# Patient Record
Sex: Female | Born: 2000 | Race: Black or African American | Hispanic: No | Marital: Single | Smoking: Former smoker
Health system: Southern US, Community
[De-identification: ages and names within clinical notes are randomized; demographics above are authoritative.]

## PROBLEM LIST (undated history)

## (undated) DIAGNOSIS — R569 Unspecified convulsions: Secondary | ICD-10-CM

## (undated) HISTORY — PX: LACERATION REPAIR: SHX5168

---

## 2020-09-24 ENCOUNTER — Emergency Department (HOSPITAL_COMMUNITY)
Admission: EM | Admit: 2020-09-24 | Discharge: 2020-09-25 | Disposition: A | Discharge status: Home / Self Care | Payer: Self-pay | Attending: Emergency Medicine | Admitting: Emergency Medicine

## 2020-09-24 ENCOUNTER — Other Ambulatory Visit: Payer: Self-pay

## 2020-09-24 DIAGNOSIS — U071 COVID-19: Secondary | ICD-10-CM | POA: Insufficient documentation

## 2020-09-24 DIAGNOSIS — R21 Rash and other nonspecific skin eruption: Secondary | ICD-10-CM | POA: Insufficient documentation

## 2020-09-24 DIAGNOSIS — Z5321 Procedure and treatment not carried out due to patient leaving prior to being seen by health care provider: Secondary | ICD-10-CM | POA: Insufficient documentation

## 2020-09-24 LAB — GROUP A STREP BY PCR: Group A Strep by PCR: NOT DETECTED

## 2020-09-24 NOTE — ED Provider Notes (Signed)
Emergency Medicine Provider Triage Evaluation Note  Donna Guzman , a 20 y.o. female  was evaluated in triage.  Pt complains of sore throat. The patient reports sore throat, congestion, and tactile fever, onset today. She also reports a rash that began one week ago. No known sick contacts. No new sexual partners. No known contacts with a similar rash. Reports a history of strep throat "around this time every year."  Review of Systems  Positive: Sore throat, congestion, fever, rash   Negative: Vomiting, diarrhea, Abdominal pain, shortness of breath  Physical Exam  BP (!) 133/99 (BP Location: Left Arm)   Pulse 91   Temp 99.8 F (37.7 C) (Oral)   Resp 16   Ht 5' 2.5" (1.588 m)   Wt 63.5 kg   SpO2 100%   BMI 25.20 kg/m  Gen:   Awake, no distress    Resp:  Normal effort  MSK:   Moves extremities without difficulty  Other:  Tonsillar edema and exudate. Posterior oropharynx is erythematous. Maculaopapular with some scaling scaling rash noted to the trunk and arms. No vesicles or pustules. Some lesions appear grouped. No red streaking. Anterior cervical lymphadenopathy.       Medical Decision Making  Medically screening exam initiated at 10:26 PM.  Appropriate orders placed.  Haylo Fake was informed that the remainder of the evaluation will be completed by another provider, this initial triage assessment does not replace that evaluation, and the importance of remaining in the ED until their evaluation is complete.  Viral symptoms onset today with a one week history of rash. Considered monkey pox, but patient has no new sexual partners or known sick contacts. Given her ENT exam, will order strep PCR and COVID-19. She will require further work up and evaluation in the ED.    Barkley Boards, PA-C 09/24/20 2233    Vanetta Mulders, MD 09/25/20 2014

## 2020-09-24 NOTE — ED Triage Notes (Signed)
Pt reports a rash all over that started a week ago. Pt denies pain or itching.

## 2020-09-24 NOTE — ED Notes (Signed)
Pt stated she was leaving ED.

## 2020-09-25 LAB — RESP PANEL BY RT-PCR (FLU A&B, COVID) ARPGX2
Influenza A by PCR: NEGATIVE
Influenza B by PCR: NEGATIVE
SARS Coronavirus 2 by RT PCR: POSITIVE — AB

## 2020-09-25 NOTE — ED Notes (Signed)
X1 for vitals recheck with no response 

## 2020-12-13 ENCOUNTER — Encounter (HOSPITAL_COMMUNITY): Payer: Self-pay | Admitting: Emergency Medicine

## 2020-12-13 ENCOUNTER — Other Ambulatory Visit: Payer: Self-pay

## 2020-12-13 ENCOUNTER — Emergency Department (HOSPITAL_COMMUNITY)
Admission: EM | Admit: 2020-12-13 | Discharge: 2020-12-17 | Disposition: A | Discharge status: Home / Self Care | Payer: Medicaid - Out of State | Attending: Emergency Medicine | Admitting: Emergency Medicine

## 2020-12-13 ENCOUNTER — Emergency Department (HOSPITAL_COMMUNITY): Payer: Medicaid - Out of State

## 2020-12-13 DIAGNOSIS — R45851 Suicidal ideations: Secondary | ICD-10-CM | POA: Insufficient documentation

## 2020-12-13 DIAGNOSIS — N9489 Other specified conditions associated with female genital organs and menstrual cycle: Secondary | ICD-10-CM | POA: Diagnosis not present

## 2020-12-13 DIAGNOSIS — E349 Endocrine disorder, unspecified: Secondary | ICD-10-CM

## 2020-12-13 DIAGNOSIS — G40909 Epilepsy, unspecified, not intractable, without status epilepticus: Secondary | ICD-10-CM | POA: Insufficient documentation

## 2020-12-13 DIAGNOSIS — R569 Unspecified convulsions: Secondary | ICD-10-CM

## 2020-12-13 DIAGNOSIS — R7309 Other abnormal glucose: Secondary | ICD-10-CM | POA: Diagnosis not present

## 2020-12-13 DIAGNOSIS — T450X4A Poisoning by antiallergic and antiemetic drugs, undetermined, initial encounter: Secondary | ICD-10-CM | POA: Diagnosis present

## 2020-12-13 DIAGNOSIS — Z20822 Contact with and (suspected) exposure to covid-19: Secondary | ICD-10-CM | POA: Diagnosis not present

## 2020-12-13 LAB — CBC WITH DIFFERENTIAL/PLATELET
Abs Immature Granulocytes: 0.02 10*3/uL (ref 0.00–0.07)
Basophils Absolute: 0 10*3/uL (ref 0.0–0.1)
Basophils Relative: 0 %
Eosinophils Absolute: 0.1 10*3/uL (ref 0.0–0.5)
Eosinophils Relative: 1 %
HCT: 30.5 % — ABNORMAL LOW (ref 36.0–46.0)
Hemoglobin: 9.2 g/dL — ABNORMAL LOW (ref 12.0–15.0)
Immature Granulocytes: 0 %
Lymphocytes Relative: 18 %
Lymphs Abs: 1.7 10*3/uL (ref 0.7–4.0)
MCH: 20.9 pg — ABNORMAL LOW (ref 26.0–34.0)
MCHC: 30.2 g/dL (ref 30.0–36.0)
MCV: 69.2 fL — ABNORMAL LOW (ref 80.0–100.0)
Monocytes Absolute: 0.7 10*3/uL (ref 0.1–1.0)
Monocytes Relative: 8 %
Neutro Abs: 6.7 10*3/uL (ref 1.7–7.7)
Neutrophils Relative %: 73 %
Platelets: 293 10*3/uL (ref 150–400)
RBC: 4.41 MIL/uL (ref 3.87–5.11)
RDW: 15.6 % — ABNORMAL HIGH (ref 11.5–15.5)
WBC: 9.3 10*3/uL (ref 4.0–10.5)
nRBC: 0 % (ref 0.0–0.2)

## 2020-12-13 LAB — COMPREHENSIVE METABOLIC PANEL
ALT: 9 U/L (ref 0–44)
AST: 15 U/L (ref 15–41)
Albumin: 3.3 g/dL — ABNORMAL LOW (ref 3.5–5.0)
Alkaline Phosphatase: 48 U/L (ref 38–126)
Anion gap: 7 (ref 5–15)
BUN: 8 mg/dL (ref 6–20)
CO2: 22 mmol/L (ref 22–32)
Calcium: 9.3 mg/dL (ref 8.9–10.3)
Chloride: 106 mmol/L (ref 98–111)
Creatinine, Ser: 0.71 mg/dL (ref 0.44–1.00)
GFR, Estimated: >60 mL/min (ref >60)
Glucose, Bld: 93 mg/dL (ref 70–99)
Potassium: 3.6 mmol/L (ref 3.5–5.1)
Sodium: 135 mmol/L (ref 135–145)
Total Bilirubin: 0.5 mg/dL (ref 0.3–1.2)
Total Protein: 7.4 g/dL (ref 6.5–8.1)

## 2020-12-13 LAB — ACETAMINOPHEN LEVEL: Acetaminophen (Tylenol), Serum: 28 ug/mL (ref 10–30)

## 2020-12-13 LAB — RESP PANEL BY RT-PCR (FLU A&B, COVID) ARPGX2
Influenza A by PCR: NEGATIVE
Influenza B by PCR: NEGATIVE
SARS Coronavirus 2 by RT PCR: NEGATIVE

## 2020-12-13 LAB — I-STAT VENOUS BLOOD GAS, ED
Acid-base deficit: 2 mmol/L (ref 0.0–2.0)
Bicarbonate: 24 mmol/L (ref 20.0–28.0)
Calcium, Ion: 1.29 mmol/L (ref 1.15–1.40)
HCT: 32 % — ABNORMAL LOW (ref 36.0–46.0)
Hemoglobin: 10.9 g/dL — ABNORMAL LOW (ref 12.0–15.0)
O2 Saturation: 88 %
Potassium: 3.6 mmol/L (ref 3.5–5.1)
Sodium: 139 mmol/L (ref 135–145)
TCO2: 25 mmol/L (ref 22–32)
pCO2, Ven: 47.3 mmHg (ref 44.0–60.0)
pH, Ven: 7.314 (ref 7.250–7.430)
pO2, Ven: 61 mmHg — ABNORMAL HIGH (ref 32.0–45.0)

## 2020-12-13 LAB — SALICYLATE LEVEL: Salicylate Lvl: <7 mg/dL — ABNORMAL LOW (ref 7.0–30.0)

## 2020-12-13 LAB — CBG MONITORING, ED: Glucose-Capillary: 105 mg/dL — ABNORMAL HIGH (ref 70–99)

## 2020-12-13 LAB — I-STAT BETA HCG BLOOD, ED (MC, WL, AP ONLY): I-stat hCG, quantitative: 17.8 m[IU]/mL — ABNORMAL HIGH (ref <5)

## 2020-12-13 LAB — HCG, QUANTITATIVE, PREGNANCY: hCG, Beta Chain, Quant, S: 29 m[IU]/mL — ABNORMAL HIGH (ref <5)

## 2020-12-13 LAB — MAGNESIUM: Magnesium: 1.8 mg/dL (ref 1.7–2.4)

## 2020-12-13 MED ORDER — LORAZEPAM 2 MG/ML IJ SOLN
INTRAMUSCULAR | Status: AC
  Filled 2020-12-13: qty 1

## 2020-12-13 MED ORDER — LORAZEPAM 2 MG/ML IJ SOLN
2.0000 mg | INTRAMUSCULAR | Status: DC | PRN
  Administered 2020-12-15: 2 mg via INTRAMUSCULAR
  Filled 2020-12-13: qty 1

## 2020-12-13 MED ORDER — LORAZEPAM 2 MG/ML IJ SOLN
2.0000 mg | Freq: Once | INTRAMUSCULAR | Status: AC
  Administered 2020-12-13: 2 mg via INTRAMUSCULAR
  Filled 2020-12-13: qty 1

## 2020-12-13 MED ORDER — LEVETIRACETAM IN NACL 1500 MG/100ML IV SOLN
1500.0000 mg | Freq: Once | INTRAVENOUS | Status: AC
  Administered 2020-12-13: 1500 mg via INTRAVENOUS

## 2020-12-13 MED ORDER — NALOXONE HCL 0.4 MG/ML IJ SOLN
0.4000 mg | Freq: Once | INTRAMUSCULAR | Status: DC
  Filled 2020-12-13: qty 1

## 2020-12-13 MED ORDER — DROPERIDOL 2.5 MG/ML IJ SOLN: 5.0000 mg | Freq: Once | INTRAMUSCULAR | Status: DC

## 2020-12-13 NOTE — ED Triage Notes (Addendum)
Pt BIB EMS due to a seizure. [place] was [place] [employer] and staff witnessed pt have 8 seizures. Pt has a hx of seizures. Pt is axox4 on arrival. Pts family states pt took a whole bottle of benadryl. Pt was trying to commit suicide today. Per EMS pt was not post ictal when they arrived

## 2020-12-13 NOTE — ED Notes (Addendum)
At approx. 2200 I approached the pt to discuss that she would be staying with [place] for psychiatric evaluation.  She was not pleased and stated that she was not staying and she wanted to speak with the doctor. After quite a bit of discussion between the doctor and the pt and escalation by the pt I stepped away for a second and quickly heard the doctor calling for security.  I came around the corner and found the EDP trying to hold the door closed so the pt could not leave.  Myself and the doctor stood in the doorway while we waited for security.   Around the time security arrived she grabbed her keychain with mace attached and threatened to spray anyone who came close. I went to get meds and returned to find a police officer with his taser pulled explaining to the pt that she is IVC'd and could not behave this way and could not leave.  It is at this time that I medicated her with 2mg  Ativan IM.  It took another 1/2 hr to 45 min to convince her to change and gather her belongings.  After further discussion with the EDP about what the plan was and what to expect, she eventually calmed down, accepted what was happening and cooperated.   Her belongings were collected and left with security at the front desk to give to her boyfriend who had been called by the pt to come get them.  Her valuables were placed in a security envelope which was then placed in a belonging bag with her knowledge.  She personally placed her cash and a gold colored necklace into a specimen cup and placed it into the security envelope.  1 cell phone, 1 phone charger, 1 key fob, 2 bronze colored bracelets, 1 yellow colored bracelet  and 1 ipod case containing 1 earpod were placed in the security envelope as well.   Pt was allowed to write down some important phone numbers on a piece of paper to make allowed phone calls. That paper is in pt folder.  Pt was informed she is allowed 2- 5-minute phone calls a day.   PT was wanded.

## 2020-12-13 NOTE — ED Provider Notes (Addendum)
[facility] EMERGENCY DEPARTMENT Provider Note   CSN: [id number] Arrival date & time: 12/13/20  1633     History Chief Complaint  Patient presents with   Seizures    Donna Guzman is a 20 y.o. female.   Seizures  20 year old female with medical history significant for seizure disorder, on Keppra who presents to the emergency department after reported overdose on Benadryl and subsequent seizures.  The patient was reportedly trying to commit suicide today and family at the [employer] state that they witnessed the patient taking a whole bottle of Benadryl in a suicide attempt.  The patient has a history of seizures and did not take her Keppra today.  She was witnessed to have approximately 8 seizures of unclear duration prior to arrival.  She arrived postictal, somnolent, GCS 12 (2-4-6), ABC intact.  Level 5 caveat due to mental status change.  EMS report: 20y/o female pt lying left lateral on the floor of a place of business currently seizing while in the care of [facility]. Staff was present to give a report of what they witnessed, which was a fall and approximately 8 seizures back to back. Staff was also on the pt's cell phone with a family member, which allowed EMS to obtain some background information. Pt was given 5mg  of Versed IM in the right deltoid to stop the seizures. Pt was then lifted from the floor to the stretcher, secured via 5 belt straps and loaded into the ambulance for further assessment and treatment. During all of this, family was able to tell EMS via speaker phone that the pt does have a PMH of seizures, takes medication for seizures, and that the pt also took "a whole bottle of Benadryl" approximately one hour prior to this event. Family member stated that the pt had called her and told her that she took the Benadryl. Vitals as noted. EKG unremarkable. Pt woke up fairly alert and somewhat oriented fairly quickly after she was loaded into the ambulance.  There was not much of a post-ictal state. Pt was able to correct EMS with her birthday. Pt was also able to tell EMS that she took approximately 10 (assumed 25mg ) Benadryl earlier, intentionally to harm herself. Pt stated that she has not had a seizure since she was a child. Pt also stated that her head hurt, likely from the fall. Pt denied any neck/back pain but a c-collar was applied due to MOI. IV established enroute. Pt remained stable and oriented but was still a bit "loopy" and very soft-spoken.  History reviewed. No pertinent past medical history.  There are no problems to display for this patient.   History reviewed. No pertinent surgical history.   OB History   No obstetric history on file.     History reviewed. No pertinent family history.     Home Medications Prior to Admission medications   Not on File    Allergies    Patient has no known allergies.  Review of Systems   Review of Systems  Unable to perform ROS: Mental status change  Neurological:  Positive for seizures.   Physical Exam Updated Vital Signs BP 111/70   Pulse 81   Temp 98.3 F (36.8 C) (Oral)   Resp 16   SpO2 97%   Physical Exam Vitals and nursing note reviewed.  Constitutional:      General: She is not in acute distress.    Appearance: She is well-developed.     Comments: GCS 12 (2-4-6).  Somnolent but arousable to tactile stimulation and will follow commands and speak.   HENT:     Head: Normocephalic and atraumatic.  Eyes:     Conjunctiva/sclera: Conjunctivae normal.     Pupils: Pupils are equal, round, and reactive to light.  Cardiovascular:     Rate and Rhythm: Normal rate and regular rhythm.     Heart sounds: No murmur heard. Pulmonary:     Effort: Pulmonary effort is normal. No respiratory distress.     Breath sounds: Normal breath sounds.  Abdominal:     General: There is no distension.     Palpations: Abdomen is soft.     Tenderness: There is no abdominal tenderness.  There is no guarding.  Musculoskeletal:        General: No deformity or signs of injury.     Cervical back: Normal range of motion and neck supple.  Skin:    General: Skin is warm and dry.     Findings: No lesion or rash.  Neurological:     Mental Status: She is alert.     Cranial Nerves: No cranial nerve deficit.     Sensory: No sensory deficit.     Motor: No weakness.     Comments: Moving all four extremities antigravity    ED Results / Procedures / Treatments   Labs (all labs ordered are listed, but only abnormal results are displayed) Labs Reviewed  CBC WITH DIFFERENTIAL/PLATELET - Abnormal; Notable for the following components:      Result Value   Hemoglobin 9.2 (*)    HCT 30.5 (*)    MCV 69.2 (*)    MCH 20.9 (*)    RDW 15.6 (*)    All other components within normal limits  COMPREHENSIVE METABOLIC PANEL - Abnormal; Notable for the following components:   Albumin 3.3 (*)    All other components within normal limits  SALICYLATE LEVEL - Abnormal; Notable for the following components:   Salicylate Lvl <7.0 (*)    All other components within normal limits  HCG, QUANTITATIVE, PREGNANCY - Abnormal; Notable for the following components:   hCG, Beta Chain, Quant, S 29 (*)    All other components within normal limits  CBG MONITORING, ED - Abnormal; Notable for the following components:   Glucose-Capillary 105 (*)    All other components within normal limits  I-STAT BETA HCG BLOOD, ED ([facility], [facility], [facility] ONLY) - Abnormal; Notable for the following components:   I-stat hCG, quantitative 17.8 (*)    All other components within normal limits  I-STAT VENOUS BLOOD GAS, ED - Abnormal; Notable for the following components:   pO2, Ven 61.0 (*)    HCT 32.0 (*)    Hemoglobin 10.9 (*)    All other components within normal limits  RESP PANEL BY RT-PCR (FLU A&B, COVID) ARPGX2  MAGNESIUM  ACETAMINOPHEN LEVEL  URINALYSIS, ROUTINE W REFLEX MICROSCOPIC  BLOOD GAS, VENOUS    EKG EKG  Interpretation  Date/Time:  Friday December 13 2020 16:38:37 EST Ventricular Rate:  97 PR Interval:  147 QRS Duration: 66 QT Interval:  332 QTC Calculation: 422 R Axis:   62 Text Interpretation: Sinus rhythm Confirmed by Ernie Avena ([id number]) on 12/13/2020 4:47:28 PM  Radiology CT HEAD WO CONTRAST ( )  Result Date: 12/13/2020 CLINICAL DATA:  Seizure, nontraumatic (Age 49-40y) Mental status change, unknown cause EXAM: CT HEAD WITHOUT CONTRAST TECHNIQUE: Contiguous axial images were obtained from the base of the skull through the vertex without intravenous contrast. COMPARISON:  None. FINDINGS: Brain: No intracranial hemorrhage, mass effect, or midline shift. No hydrocephalus. The basilar cisterns are patent. No evidence of territorial infarct or acute ischemia. No extra-axial or intracranial fluid collection. Vascular: No hyperdense vessel or unexpected calcification. Skull: Normal. Negative for fracture or focal lesion. Sinuses/Orbits: Paranasal sinuses and mastoid air cells are clear. The visualized orbits are unremarkable. Other: None. IMPRESSION: Negative noncontrast head CT. Electronically Signed   By: Narda Rutherford M.D.   On: 12/13/2020 18:56    Procedures Procedures   Medications Ordered in ED Medications  levETIRAcetam (KEPPRA) IVPB 1500 mg/ 100 mL premix (0 mg Intravenous Stopped 12/13/20 1753)    ED Course  I have reviewed the triage vital signs and the nursing notes.  Pertinent labs & imaging results that were available during my care of the patient were reviewed by me and considered in my medical decision making (see chart for details).    MDM Rules/Calculators/A&P                           20 year old female with medical history significant for seizure disorder, on Keppra who presents to the emergency department after reported overdose on Benadryl and subsequent seizures.  The patient was reportedly trying to commit suicide today and family at the [employer] store state  that they witnessed the patient taking a whole bottle of Benadryl in a suicide attempt.  The patient has a history of seizures and did not take her Keppra today.  She was witnessed to have approximately 8 seizures of unclear duration prior to arrival.  She arrived postictal, somnolent, GCS 12 (2-4-6), ABC intact.  The patient was loaded with 1500 mg of Keppra on arrival.  Unclear if additional coingestions.  The patient is not presenting with a clear anticholinergic toxidrome.  Her pupils on exam were pinpoint.  On arrival, the patient was vitally stable.  An EKG was performed on arrival significant for sinus rhythm, pulse 97, normal intervals, QRS 6 6, QTc 422, no ST segment changes or T wave inversions.  VBG unremarkable, pH 7.31 PCO2 47, HCO3 24, mildly low hemoglobin 10.9, i-STAT beta-hCG elevated to 17.8, quantitative hCG serum elevated to 29.  The patient states that she could be pregnant.  Acetaminophen level 28.  CMP unremarkable.  COVID-19 and influenza PCR negative.  CT of the head without acute intracranial abnormality.  CBC without a leukocytosis, microcytic anemia to 9.2, no platelet abnormality, magnesium normal 1.8.  On repeat assessment, the patient was alert and oriented x3, GCS 15, no longer postictal.  She states that she thinks she took around 4x 25 mg Benadryl or 100 mg total.  This ingestion occurred 1 hour prior to arrival.  She continues to display no symptoms of an anticholinergic toxidrome.  At this time, due to lack of toxidrome, observation status postingestion for close to 4 hours, feel the patient is currently medically cleared for psychiatric evaluation.    Of note, the patient's HPI has changed from her EMS report to now.  She currently denies SI, HI, AVH.  She denies any history of depression.  She states this was not a suicide attempt. She states she took three Benadryl, however, she told me earlier she took four. She reportedly told EMS that she took 10 25mg  Benadryl. She  continues to be without an anticholinergic toxidrome and has since returned to her baseline.  I am unable to obtain collateral information at this time. TTS consult placed.  10:10 PM When informed of the need to stay for possible psychiatric consultation, the patient told me she called her husband to provide collateral.  The number she reportedly called was listed in her phone as her brother.  He was unable to provide any information regarding the events that occurred earlier today.  The patient demanded to leave and made threats to staff on attempts to leave the emergency department.  She subsequently became agitated and combative and threatened to [facility] various staff members when attempts were made to take her belongings.  She subsequently required IM Ativan for chemical restraint. Given the patient's changing story, erratic behavior, reported suicide attempt by overdose earlier today, aggressive behavior, IVC paperwork filed.  Final Clinical Impression(s) / ED Diagnoses Final diagnoses:  Seizures (HCC)  Diphenhydramine overdose of undetermined intent, initial encounter    Rx / DC Orders ED Discharge Orders     None        Ernie Avena, MD 12/13/20 1914        Ernie Avena, MD 12/13/20 2222    Ernie Avena, MD 12/14/20 2359

## 2020-12-14 LAB — URINALYSIS, ROUTINE W REFLEX MICROSCOPIC
Bilirubin Urine: NEGATIVE
Glucose, UA: NEGATIVE mg/dL
Hgb urine dipstick: NEGATIVE
Ketones, ur: NEGATIVE mg/dL
Nitrite: NEGATIVE
Protein, ur: NEGATIVE mg/dL
Specific Gravity, Urine: 1.027 (ref 1.005–1.030)
pH: 6 (ref 5.0–8.0)

## 2020-12-14 LAB — HCG, QUANTITATIVE, PREGNANCY: hCG, Beta Chain, Quant, S: 59 m[IU]/mL — ABNORMAL HIGH (ref <5)

## 2020-12-14 LAB — RAPID URINE DRUG SCREEN, HOSP PERFORMED
Amphetamines: NOT DETECTED
Barbiturates: NOT DETECTED
Benzodiazepines: POSITIVE — AB
Cocaine: NOT DETECTED
Opiates: NOT DETECTED
Tetrahydrocannabinol: POSITIVE — AB

## 2020-12-14 LAB — PREGNANCY, URINE: Preg Test, Ur: NEGATIVE

## 2020-12-14 NOTE — B[facility] Counselor (Signed)
TTS received IVC and is now waiting for RN to room pt to complete the assessment.

## 2020-12-14 NOTE — ED Notes (Signed)
Patient currently using provided phone to make a personal phone call.  Has been updated on plan of care

## 2020-12-14 NOTE — ED Notes (Signed)
Lunch tray arrived, pt states she is allergic to the dumplings that are on her tray, this RN called to reorder her a lunch tray, will arrive around 2pm

## 2020-12-14 NOTE — ED Notes (Signed)
Pt using the phone at this time.  

## 2020-12-14 NOTE — BH Assessment (Signed)
TTS clinician attempted TTS consult. Per Ivin Booty, RN, patient medicated and asleep, unable to participate in assessment. TTS will attempt at later time.

## 2020-12-14 NOTE — ED Notes (Signed)
Attempted to take vitals, pt currently using the phone.

## 2020-12-14 NOTE — ED Notes (Signed)
Called lab to add urine pregnancy due to elevated hcg
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
## 2020-12-15 MED ORDER — HALOPERIDOL LACTATE 5 MG/ML IJ SOLN
2.5000 mg | Freq: Once | INTRAMUSCULAR | Status: AC
  Administered 2020-12-15: 2.5 mg via INTRAMUSCULAR
  Filled 2020-12-15: qty 1

## 2020-12-15 NOTE — ED Notes (Signed)
Pt is awake and alert, calm, and cooperative at this time. She requested warm blankets and has returned to her bed without incident.

## 2020-12-15 NOTE — ED Notes (Signed)
Pt has been found walking the hallway out of the pod and leaving her assigned area. She has been asked multiple times to stay in her assigned area & to not stand in the middle of the hallway. Security did stop by again to have a word with her, however, pt continues to walk away, ignoring staff.

## 2020-12-15 NOTE — ED Notes (Addendum)
Pt having her 1st phone call of the day approximately 10 minutes ago. Pt began rasing her voice and cussing at the recipient of the phone call. Pt asked to lower her voice and hang up at this time by primary RN. Pt then threw the phone onto the floor and began cussing at the staff stating, "Lavenia Atlas been here 3 days. Ya'll don't care about me. Don't worry about me now. I'm just the little black girl in the hallway. I'm leaving so go ahead and call your little police officer or whatever you got to do cause I'm leaving." Security call and pt began walking towards the exit. Pt was able to be escorted back to her bed by Malachi Bonds, tech. Pt calm at this time taking a shower. Dr. Wallace Cullens aware.

## 2020-12-15 NOTE — ED Notes (Signed)
Pt has remained agitated, wandering, not following directions. Cursing and threatening to leave. RN, MD and pharmacist discussed medication options safe in pregnancy, minimal dose of haldol found to be safe. Pt was given IM haldol at this time with security at bedside.

## 2020-12-15 NOTE — ED Notes (Signed)
Pt laying in bed on her side in hallway, normal work of breathing and no distress noted. RN did not disturb pt.

## 2020-12-15 NOTE — ED Notes (Addendum)
Pt has been increasingly agitated since around 0830, abusing phone use and yelling "I've been here three fuckin days, I'm not staying here no more!" Pt states she was never suicidal and never took any pills. Pt did require IM ativan and security assistance for meds. Pt states "I'm going to run the fuck out of here." Security at bedside at this time.

## 2020-12-15 NOTE — Progress Notes (Signed)
Per Vivien Presto, patient meets criteria for inpatient treatment. There are no available or appropriate beds at [facility] today, per [facility]. CSW faxed referrals to the following facilities for review:  [facility]  Pending - No Request Sent N/A [address] [place] [address] [phone] --  [facility]  Pending - No Request Sent N/A [address] [place] [address] [phone] --  [facility]  Pending - No Request Sent N/A [address] [place] [address] [phone] --  [facility]  Pending - No Request Sent N/A [address] [place] [address] [phone] --  [facility]  Pending - No Request Sent N/A [address] [place], [place] [address] [phone] --  [facility]  Pending - No Request Sent N/A [address] [place] [address] [phone] --  [facility]  Pending - No Request Sent N/A [address] [place] [address] [phone] --  [facility]  Pending - No Request Sent N/A [address] [place] [address] [phone] --  [facility]  Pending - No Request Sent N/A [address], [place] [address] [phone] --  [facility]  Pending - No Request Sent N/A [address] [place] [address] [phone] --  [facility]  Pending - No Request Sent N/A [address] Karolee Ohs., [place] [address] [phone] --  [facility]  Pending - No Request Sent N/A [address] [place] [address] [phone] --  [facility]  Pending - No Request Sent N/A [address] [place] [address] [phone]      TTS will continue to seek bed placement.  Crissie Reese, MSW, LCSW-A, LCAS-A Phone: [phone] Disposition/TOC

## 2020-12-15 NOTE — ED Notes (Signed)
Pt is currently having her 2nd phone call. This will make it her last/final call of the day until tomorrow.

## 2020-12-15 NOTE — ED Notes (Signed)
Patient out of bed to use restroom.  Requesting a snack at this time.  Provided with apple juice and sandwich pack.  Patient updated on plan of care.  Currently calm and cooperative

## 2020-12-15 NOTE — ED Notes (Signed)
Pt sitting up in stretcher talking to security staff, remains intermittently agitated, currently talking calmly but reverts to yelling and cursing, then calms again.

## 2020-12-15 NOTE — ED Notes (Signed)
Pt still standing around hallway bed and periodically wandering. Has calmed down and not cursing but refusing to sit on bed or stay in assigned area. RN discussing with psych NP And SW via secure chat.

## 2020-12-15 NOTE — ED Notes (Signed)
Pt woke up around 1600 and demanded to use phone again. Re-oriented pt to IVC status and rules regarding phone use. Pt attempted to elope at this time, ran out back door but was apprehended by security staff and returned to department. Dr Posey Rea gave verbal order for restraints and haldol. Pt is oriented but irrational. Tried to communicate with pt that she does not have to go in restraints or be medicated if she chooses to comply with plan of care, but pt says "fuck this bitch, I want the fuck out of here, fuck your choices bitch." Pt was placed in 4 point restraints at 1605 as she kicked her wrists out of the 2 point restraints. Pt states "I been to a real psych ward, this ain't shit."  Pt with sitter at bedside, currently in 4 point restraints and cursing at staff.

## 2020-12-15 NOTE — ED Notes (Signed)
Patient currently resting on bed in hallway.  Has head covered with blanket.  Respirations even and unlabored.

## 2020-12-15 NOTE — ED Notes (Signed)
Pt resting in bed with eyes closed. Released restraints at this time from all four extremities. Sitter at bedside.

## 2020-12-16 MED ORDER — LEVETIRACETAM 500 MG PO TABS
500.0000 mg | ORAL_TABLET | Freq: Two times a day (BID) | ORAL | Status: DC
  Administered 2020-12-17: 500 mg via ORAL
  Filled 2020-12-16: qty 1

## 2020-12-16 NOTE — ED Notes (Addendum)
Pt has had a wonderful day with no complaints. Pt has been calm, cooperative, and understanding all day. No seizure activity noted since pt has arrived to emergency room.  Pt now updated on plan of care

## 2020-12-16 NOTE — ED Notes (Signed)
Pt to shower with sitter. Given new set of scrubs and linen changed

## 2020-12-16 NOTE — BH Assessment (Signed)
RE-ASSESSMENT   This Clinical research associate evaluated patient this date to assess current mental health status. Patient denies any S/I, H/I or AVH. Patient presents with a pleasant affect and is requesting to be discharged although renders a conflicting history this date in reference to why she initially presented. Patient states she resides in [place] with her husband and commutes to [place] to "work at [employer]" although states she "lost her job" but still "stays in hotels on weekends" because her and her husband have been separated. Patient gives consent to speak to husband Pennie Rushing [phone] this date although he is unable to be reached. Patient denies any past mental health diagnosis although per notes on 11/13 she stated "I have been to a real psych ward and this ain't s#it." See note of 11/13 at 1630. Rankin NP recommends a continued inpatient admssion to assist with ongoing needs.

## 2020-12-16 NOTE — Progress Notes (Signed)
Pt is under review at [facility] for inpatient behavioral health placement.  Per [facility] Malva Limes, RN ---consulted with Dr. Lucianne Muss who recommends due to pregnancy and seizure disorder to restart keppra and if seizure free bhh may consider tomorrow.  CSW will contuine to assist with inpatient behavioral health placement.  Maryjean Ka, MSW, LCSWA 12/16/2020 11:14 PM

## 2020-12-16 NOTE — ED Notes (Signed)
Pt up to desk making first phone call. Pt is calm and cooperative at this time.

## 2020-12-16 NOTE — ED Notes (Addendum)
Pt resting with eyes closed in bed. Sitter at bedside. IVC paperwork in yellow folder at nurses station

## 2020-12-16 NOTE — ED Notes (Signed)
Pt is awake and calm at this time. Vitals obtained and pt was given apple juice and a warm blanket. No concerns expressed and no issues to report.

## 2020-12-17 ENCOUNTER — Encounter (HOSPITAL_COMMUNITY): Payer: Self-pay | Admitting: Registered Nurse

## 2020-12-17 DIAGNOSIS — R45851 Suicidal ideations: Secondary | ICD-10-CM

## 2020-12-17 DIAGNOSIS — R569 Unspecified convulsions: Secondary | ICD-10-CM

## 2020-12-17 MED ORDER — LEVETIRACETAM 750 MG PO TABS
750.0000 mg | ORAL_TABLET | Freq: Two times a day (BID) | ORAL | 0 refills | Status: DC
Start: 2020-12-17 — End: 2020-12-27

## 2020-12-17 NOTE — Progress Notes (Signed)
Outpatient counseling resources attached to AVS

## 2020-12-17 NOTE — Discharge Instructions (Addendum)
Your pregnancy test was positive.  You need to follow-up with a gynecologist.  You should also follow-up with a neurologist.  You should not drive until cleared by neurology, typically they recommend being seizure-free for at least 6 months.  If you have any thoughts of depression, suicide, hurting yourself or further seizures, please come back to ER for reassessment.  Our neurologist has recommended increasing her Keppra to 750 mg twice a day while you are pregnant.

## 2020-12-17 NOTE — ED Notes (Addendum)
Pt need neuro consult. Dr. Stevie Kern aware.

## 2020-12-17 NOTE — Consult Note (Signed)
Telepsych Consultation   Reason for Consult:  Suicidal ideation Referring Physician:  Regan Lemming, MD Location of Patient: [facility] ED Location of Provider: Other: [facility]  Patient Identification: Donna Guzman MRN:  [id number] Principal Diagnosis: Seizure [facility]) Diagnosis:  Principal Problem:   Seizure ([place]) Active Problems:   Suicidal ideation   Total Time spent with patient: 30 minutes  Subjective:   Donna Guzman is a 21 y.o. female patient admitted [facility] ED after being brought in by EMS with complaints of overdose on benadryl.  HPI:  Donna Guzman, 20 y.o., female patient seen via tele health by this provider, consulted with Dr. Hampton Abbot; and chart reviewed on 12/17/20.  On evaluation Donna Guzman reports she is feeling fine and that she is ready to go home.  Patient states she has been on her best behavior.  Patient reports she never said or told anyone that she tried to kill herself.  States that she did take some benadryl but it was to try and sleep.  "I promise to God I didn't try to kill myself.  I had taken some Benadryl earlier because I wanted to sleep; but I had my sister on the phone and told her I had went to store.  I had her on the phone when I started feeling light headed; she told me to call ambulance and I remember asking someone in store to call just before I passed out and that's all I remember."  Patient denies suicidal/self-harm/homicidal ideation, psychosis, and paranoia. "  Gave permission to speak with husband.  Informed unable to contact then gave permission to speak with her sister.   During evaluation Donna Guzman is sitting upright in chair in no acute distress.  She is alert, oriented x 4, pleasant, calm and cooperative.  Her mood is euthymic with congruent affect.  She does not appear to be responding to internal/external stimuli or delusional thoughts.  Patient denies suicidal/self-harm/homicidal ideation, psychosis, and paranoia.  Patient answered question  appropriately.  Collateral Information:  Spoke to patients' sister Donna Guzman at [phone].  States that she feels that patient is safe and not a danger to herself.  States that patient will be staying with her for a while but would like resources for neurology and OB.  When asked if she felt sister would kill herself she stated "She crazy but not that crazy."  States she was on the phone with sister prior to sister having seizure and passing out.  States that patient had not taken an overdose of benadryl and hadn't tired to kill herself.  States she will come pick sister up at ED because doesn't want her driving related to seizure activity.    Past Psychiatric History: Denies prior psychiatric history  Risk to Self:  Denies Risk to Others:  Denies Prior Inpatient Therapy:  Denies Prior Outpatient Therapy:  Denies  Past Medical History: History reviewed. No pertinent past medical history. History reviewed. No pertinent surgical history. Family History: History reviewed. No pertinent family history. Family Psychiatric  History: Denies Social History:  Social History   Substance and Sexual Activity  Alcohol Use None     Social History   Substance and Sexual Activity  Drug Use Not on file    Social History   Socioeconomic History   Marital status: Married    Spouse name: Not on file   Number of children: Not on file   Years of education: Not on file   Highest education level: Not on file  Occupational History   Not on file  Tobacco Use   Smoking status: Not on file   Smokeless tobacco: Not on file  Substance and Sexual Activity   Alcohol use: Not on file   Drug use: Not on file   Sexual activity: Not on file  Other Topics Concern   Not on file  Social History Narrative   Not on file   Social Determinants of Health   Financial Resource Strain: Not on file  Food Insecurity: Not on file  Transportation Needs: Not on file  Physical Activity: Not on file  Stress:  Not on file  Social Connections: Not on file   Additional Social History:    Allergies:  No Known Allergies  Labs: No results found for this or any previous visit (from the past 48 hour(s)).  Medications:  Current Facility-Administered Medications  Medication Dose Route Frequency Provider Last Rate Last Admin   levETIRAcetam (KEPPRA) tablet 500 mg  500 mg Oral BID Joanne Brander B, NP   500 mg at 12/17/20 0926   LORazepam (ATIVAN) injection 2 mg  2 mg Intramuscular Q4H PRN Regan Lemming, MD   2 mg at 12/15/20 [id number]   Current Outpatient Medications  Medication Sig Dispense Refill   levETIRAcetam (KEPPRA) 500 MG tablet Take 500 mg by mouth in the morning and at bedtime.      Musculoskeletal: Strength & Muscle Tone: within normal limits Gait & Station: normal Patient leans: N/A     Psychiatric Specialty Exam:  Presentation  General Appearance: Appropriate for Environment  Eye Contact:Good  Speech:Clear and Coherent; Normal Rate  Speech Volume:Normal  Handedness:Right   Mood and Affect  Mood:Euthymic  Affect:Appropriate; Congruent   Thought Process  Thought Processes:Coherent; Goal Directed  Descriptions of Associations:Intact  Orientation:Full (Time, Place and Person)  Thought Content:Logical; WDL  History of Schizophrenia/Schizoaffective disorder:No  Duration of Psychotic Symptoms:No data recorded Hallucinations:Hallucinations: None  Ideas of Reference:None  Suicidal Thoughts:Suicidal Thoughts: No  Homicidal Thoughts:Homicidal Thoughts: No   Sensorium  Memory:Immediate Good; Recent Good; Remote Good  Judgment:Intact  Insight:Present   Executive Functions  Concentration:Good  Attention Span:Good  Baker of Knowledge:Good  Language:Good   Psychomotor Activity  Psychomotor Activity:Psychomotor Activity: Normal   Assets  Assets:Communication Skills; Desire for Improvement; Financial Resources/Insurance; Housing;  Resilience; Social Support; Transportation   Sleep  Sleep:Sleep: Good    Physical Exam: Physical Exam Vitals and nursing note reviewed. Exam conducted with a chaperone present.  Constitutional:      General: She is not in acute distress.    Appearance: Normal appearance. She is not ill-appearing.  Cardiovascular:     Rate and Rhythm: Normal rate.  Pulmonary:     Effort: Pulmonary effort is normal.  Neurological:     Mental Status: She is alert and oriented to person, place, and time.  Psychiatric:        Attention and Perception: Attention and perception normal. She does not perceive auditory or visual hallucinations.        Mood and Affect: Mood and affect normal.        Speech: Speech normal.        Behavior: Behavior normal. Behavior is cooperative.        Thought Content: Thought content normal. Thought content is not paranoid or delusional. Thought content does not include homicidal or suicidal ideation.        Cognition and Memory: Cognition and memory normal.        Judgment: Judgment normal.  Review of Systems  Constitutional: Negative.   HENT: Negative.    Eyes: Negative.   Respiratory: Negative.    Cardiovascular: Negative.   Gastrointestinal: Negative.   Genitourinary: Negative.   Musculoskeletal: Negative.   Skin: Negative.   Neurological: Negative.   Endo/Heme/Allergies: Negative.   Psychiatric/Behavioral:  Negative for hallucinations (Denies) and suicidal ideas. Depression: Stable. Substance abuse: THC.The patient is not nervous/anxious (Stable) and does not have insomnia.   Blood pressure 99/62, pulse 78, temperature 98 F (36.7 C), resp. rate 16, SpO2 100 %. There is no height or weight on file to calculate BMI.  Treatment Plan Summary: Plan Psychiatrically clear to follow up with outpatient psychiatric services, neurology, and OB  Social work/TOC consult ordered:  Patient psychiatrically cleared.  Will need referral/resources for neurology and OB/GYN.   Also need resources for outpatient psychiatric services added to AVS.    Disposition: No evidence of imminent risk to self or others at present.   Patient does not meet criteria for psychiatric inpatient admission. Supportive therapy provided about ongoing stressors. Refer to IOP. Discussed crisis plan, support from social network, calling 911, coming to the Emergency Department, and calling Suicide Hotline.  This service was provided via telemedicine using a 2-way, interactive audio and video technology.  Names of all persons participating in this telemedicine service and their role in this encounter. Name: Assunta Found Role: NP  Name: Dr. Nelly Rout Role: Psychiatrist  Name: Donna Guzman Role: Patient  Name:  Role:     Secure message sent to patient's nurse [place] Boney, RN informing: Psychiatric assessment completed and patient psychiatrically cleared.  Patient will need prescription for Keppra until she is able to follow-up with her neurologist and obstetrician.  Patient's sister will be picking her up from the hospital had concerns and did not want patient driving related to seizure activity.  Resources for outpatient psychiatric services to be added to AVS by social work.  Please inform MD only default listed.  Donna Moultrie, NP 12/17/2020 11:36 AM

## 2020-12-17 NOTE — ED Provider Notes (Signed)
Emergency Medicine Observation Re-evaluation Note  Donna Guzman is a 20 y.o. female, seen on rounds today.  Pt initially presented to the ED for complaints of Seizures Currently, the patient is resting, awaiting in patient psych.  Physical Exam  BP 99/62 (BP Location: Left Arm)   Pulse 78   Temp 98 F (36.7 C)   Resp 16   SpO2 100%  Physical Exam General: calm Cardiac: warm and well perfused Lungs: even and unlabored Psych: cvalm  ED Course / MDM  EKG:EKG Interpretation  Date/Time:  Friday December 13 2020 16:38:37 EST Ventricular Rate:  97 PR Interval:  147 QRS Duration: 66 QT Interval:  332 QTC Calculation: 422 R Axis:   62 Text Interpretation: Sinus rhythm Confirmed by Ernie Avena ([id number]) on 12/13/2020 4:47:28 PM  I have reviewed the labs performed to date as well as medications administered while in observation.  Recent changes in the last 24 hours include psych reassessed yesterday and still rec in pt.  Plan  Current plan is for in patient, appreciate psych team assistance in finding placment Donna Guzman is under involuntary commitment.      Milagros Loll, MD 12/17/20 [phone]

## 2020-12-18 ENCOUNTER — Encounter (HOSPITAL_COMMUNITY): Payer: Self-pay | Admitting: Obstetrics & Gynecology

## 2020-12-18 ENCOUNTER — Inpatient Hospital Stay (HOSPITAL_COMMUNITY)
Admission: AD | Admit: 2020-12-18 | Discharge: 2020-12-18 | Disposition: A | Discharge status: Home / Self Care | Payer: Medicaid - Out of State | Attending: Obstetrics & Gynecology | Admitting: Obstetrics & Gynecology

## 2020-12-18 ENCOUNTER — Inpatient Hospital Stay (HOSPITAL_COMMUNITY): Payer: Medicaid - Out of State

## 2020-12-18 ENCOUNTER — Ambulatory Visit: Payer: Medicaid Other | Admitting: Neurology

## 2020-12-18 DIAGNOSIS — Z87891 Personal history of nicotine dependence: Secondary | ICD-10-CM | POA: Insufficient documentation

## 2020-12-18 DIAGNOSIS — O26891 Other specified pregnancy related conditions, first trimester: Secondary | ICD-10-CM | POA: Diagnosis present

## 2020-12-18 DIAGNOSIS — R109 Unspecified abdominal pain: Secondary | ICD-10-CM | POA: Diagnosis not present

## 2020-12-18 DIAGNOSIS — Z3A01 Less than 8 weeks gestation of pregnancy: Secondary | ICD-10-CM | POA: Diagnosis not present

## 2020-12-18 DIAGNOSIS — O3680X0 Pregnancy with inconclusive fetal viability, not applicable or unspecified: Secondary | ICD-10-CM | POA: Insufficient documentation

## 2020-12-18 DIAGNOSIS — M545 Low back pain, unspecified: Secondary | ICD-10-CM | POA: Insufficient documentation

## 2020-12-18 DIAGNOSIS — R10819 Abdominal tenderness, unspecified site: Secondary | ICD-10-CM | POA: Insufficient documentation

## 2020-12-18 HISTORY — DX: Unspecified convulsions: R56.9

## 2020-12-18 LAB — URINALYSIS, ROUTINE W REFLEX MICROSCOPIC
Bilirubin Urine: NEGATIVE
Glucose, UA: NEGATIVE mg/dL
Hgb urine dipstick: NEGATIVE
Ketones, ur: NEGATIVE mg/dL
Nitrite: NEGATIVE
Protein, ur: NEGATIVE mg/dL
Specific Gravity, Urine: 1.029 (ref 1.005–1.030)
pH: 5 (ref 5.0–8.0)

## 2020-12-18 MED ORDER — PREPLUS 27-1 MG PO TABS: 1.0000 | ORAL_TABLET | Freq: Every day | ORAL | 13 refills | Status: DC

## 2020-12-18 NOTE — MAU Provider Note (Signed)
History     CSN: [id number]  Arrival date and time: 12/18/20 1045  Event Date/Time  First Provider Initiated Contact with Patient 12/18/20 1131     Chief Complaint  Patient presents with   Back Pain   HPI Donna Guzman is a 20 y.o. G1P0 at [redacted]w[redacted]d who presents to MAU with chief complaint of low back pain and abdominal tenderness. These are new problems, onset within the past two days. Patient's back pain is on her left side near her SI joint. She is unable to provide a pain score. She denies aggravating or alleviating factors. Pain was not the result of a fall or other trauma. She has not taken medication or tried other treatments for these complaints. She denies dysuria, vaginal bleeding, abnormal vaginal discharge, fever or recent illness.  OB History     Gravida  1   Para      Term      Preterm      AB      Living         SAB      IAB      Ectopic      Multiple      Live Births              Past Medical History:  Diagnosis Date   Seizures (H[facility])     Past Surgical History:  Procedure Laterality Date   NO PAST SURGERIES      Family History  Problem Relation Age of Onset   Diabetes Mother    Hypertension Mother    Hypertension Father    ADD / ADHD Sister     Social History   Tobacco Use   Smoking status: Former    Types: Cigarettes    Quit date: 12/13/2020    Years since quitting: 0.0   Smokeless tobacco: Former  Building services engineer Use: Former  Substance Use Topics   Alcohol use: Not Currently   Drug use: Not Currently    Allergies: No Known Allergies  Medications Prior to Admission  Medication Sig Dispense Refill Last Dose   levETIRAcetam (KEPPRA) 750 MG tablet Take 1 tablet (750 mg total) by mouth in the morning and at bedtime. 60 tablet 0 12/17/2020    Review of Systems  Musculoskeletal:  Positive for back pain.  All other systems reviewed and are negative. Physical Exam   Blood pressure 122/81, pulse 89, temperature 98.5 F  (36.9 C), resp. rate 18, height 5' 2.5" (1.588 m), weight 67.6 kg, last menstrual period 11/18/2020, SpO2 100 %.  Physical Exam Vitals and nursing note reviewed. Exam conducted with a chaperone present.  Constitutional:      Appearance: Normal appearance.  Cardiovascular:     Rate and Rhythm: Normal rate and regular rhythm.     Pulses: Normal pulses.     Heart sounds: Normal heart sounds.  Pulmonary:     Effort: Pulmonary effort is normal.     Breath sounds: Normal breath sounds.  Abdominal:     General: Bowel sounds are normal.  Skin:    Capillary Refill: Capillary refill takes less than 2 seconds.  Neurological:     Mental Status: She is alert and oriented to person, place, and time.  Psychiatric:        Mood and Affect: Mood normal.        Behavior: Behavior normal.        Thought Content: Thought content normal.  Judgment: Judgment normal.    MAU Course/MDM  Procedures  Orders Placed This Encounter  Procedures   US OB LESS THAN 14 WEEKS WITH OB TRANSVAGINAL   US OB Transvaginal   Urinalysis, Routine w reflex microscopic Urine, Clean Catch   Discharge patient   Patient Vitals for the past 24 hrs:  BP Temp Pulse Resp SpO2 Height Weight  12/18/20 1430 125/68 -- 80 18 -- -- --  12/18/20 1118 122/81 -- 89 -- 100 % -- --  12/18/20 1105 116/77 98.5 F (36.9 C) 87 18 -- 5' 2.5" (1.588 m) 67.6 kg   Results for orders placed or performed during the hospital encounter of 12/18/20 (from the past 24 hour(s))  Urinalysis, Routine w reflex microscopic Urine, Clean Catch     Status: Abnormal   Collection Time: 12/18/20 11:21 AM  Result Value Ref Range   Color, Urine YELLOW YELLOW   APPearance CLOUDY (A) CLEAR   Specific Gravity, Urine 1.029 1.005 - 1.030   pH 5.0 5.0 - 8.0   Glucose, UA NEGATIVE NEGATIVE mg/dL   Hgb urine dipstick NEGATIVE NEGATIVE   Bilirubin Urine NEGATIVE NEGATIVE   Ketones, ur NEGATIVE NEGATIVE mg/dL   Protein, ur NEGATIVE NEGATIVE mg/dL    Nitrite NEGATIVE NEGATIVE   Leukocytes,Ua TRACE (A) NEGATIVE   RBC / HPF 0-5 0 - 5 RBC/hpf   WBC, UA 6-10 0 - 5 WBC/hpf   Bacteria, UA RARE (A) NONE SEEN   Squamous Epithelial / LPF 21-50 0 - 5   Mucus PRESENT    Amorphous Crystal PRESENT    US OB LESS THAN 14 WEEKS WITH OB TRANSVAGINAL  Result Date: 12/18/2020 CLINICAL DATA:  Positive pregnancy test, abdominal pain EXAM: OBSTETRIC <14 WK [place] AND TRANSVAGINAL OB US TECHNIQUE: Both transabdominal and transvaginal ultrasound examinations were performed for complete evaluation of the gestation as well as the maternal uterus, adnexal regions, and pelvic cul-de-sac. Transvaginal technique was performed to assess early pregnancy. COMPARISON:  None. FINDINGS: Intrauterine gestational sac: 0 Yolk sac:  Not seen Embryo:  Not seen Cardiac Activity: Not seen Maternal uterus/adnexae: Trace amount of fluid is seen in the endometrial cavity. Cervix is closed. Right ovary measures 3.2 x 1.9 x 2.6 cm. Left ovary measures 2.1 x 2.5 x 3.1 cm. There is 2.4 x 2.1 x 2.9 cm smooth marginated hypoechoic structure in the margin of the left ovary, possibly paraovarian cyst. IMPRESSION: There is no demonstrable intrauterine gestational sac. If pregnancy test is positive, differential diagnostic possibilities would include failed or very early IUP or ectopic gestation. Serial HCG estimations and follow-up sonogram as warranted may be considered. There is 2.4 cm cystic structure with internal debris in the margin of the left ovary., possibly suggesting paraovarian cyst. Short-term follow-up sonogram in 1-2 months may be considered to assess resolution. Electronically Signed   By: Ernie Avena M.D.   On: 12/18/2020 14:04    Meds ordered this encounter  Medications   Prenatal Vit-Fe Fumarate-FA (PREPLUS) 27-1 MG TABS    Sig: Take 1 tablet by mouth daily.    Dispense:  30 tablet    Refill:  13    Order Specific Question:   Supervising Provider    Answer:   Adam Phenix [[id number]]    Assessment and Plan  --20 y.o. G1P0 with pregnancy of unknown location --Previous appropriate rise in quant hCG during admission 11/11 & 11/12 --Will schedule viability scan in two weeks at [facility] --First trimester precautions reviewed with patient --Discharge home  in stable condition  Clayton Bibles, MSA, MSN, CNM Certified Nurse Midwife, Biochemist, clinical for [employer], [facility]

## 2020-12-18 NOTE — MAU Note (Signed)
Pt reports she was hospitalized for 5 days due to seizures and during her hospitalization they found out she was pregnant. HCG level 25 then 59. [facility] an then was [facility] here. Is c/o mild back pain on her left side that started 3 days ago. Denies any vaginal bleeding or discharge.

## 2020-12-18 NOTE — Discharge Instructions (Signed)

## 2020-12-27 ENCOUNTER — Emergency Department (HOSPITAL_COMMUNITY)
Admission: EM | Admit: 2020-12-27 | Discharge: 2020-12-27 | Disposition: A | Discharge status: Home / Self Care | Payer: Medicaid - Out of State | Attending: Emergency Medicine | Admitting: Emergency Medicine

## 2020-12-27 ENCOUNTER — Encounter (HOSPITAL_COMMUNITY): Payer: Self-pay

## 2020-12-27 ENCOUNTER — Emergency Department (HOSPITAL_COMMUNITY): Payer: Medicaid - Out of State

## 2020-12-27 DIAGNOSIS — Y900 Blood alcohol level of less than 20 mg/100 ml: Secondary | ICD-10-CM | POA: Diagnosis not present

## 2020-12-27 DIAGNOSIS — O99351 Diseases of the nervous system complicating pregnancy, first trimester: Secondary | ICD-10-CM | POA: Insufficient documentation

## 2020-12-27 DIAGNOSIS — R569 Unspecified convulsions: Secondary | ICD-10-CM | POA: Insufficient documentation

## 2020-12-27 DIAGNOSIS — Z20822 Contact with and (suspected) exposure to covid-19: Secondary | ICD-10-CM | POA: Diagnosis not present

## 2020-12-27 DIAGNOSIS — Z3490 Encounter for supervision of normal pregnancy, unspecified, unspecified trimester: Secondary | ICD-10-CM

## 2020-12-27 DIAGNOSIS — Z87891 Personal history of nicotine dependence: Secondary | ICD-10-CM | POA: Diagnosis not present

## 2020-12-27 DIAGNOSIS — Z8616 Personal history of COVID-19: Secondary | ICD-10-CM | POA: Insufficient documentation

## 2020-12-27 DIAGNOSIS — J101 Influenza due to other identified influenza virus with other respiratory manifestations: Secondary | ICD-10-CM | POA: Diagnosis not present

## 2020-12-27 LAB — SALICYLATE LEVEL: Salicylate Lvl: <7 mg/dL — ABNORMAL LOW (ref 7.0–30.0)

## 2020-12-27 LAB — I-STAT ARTERIAL BLOOD GAS, ED
Acid-base deficit: 4 mmol/L — ABNORMAL HIGH (ref 0.0–2.0)
Bicarbonate: 20.5 mmol/L (ref 20.0–28.0)
Calcium, Ion: 1.29 mmol/L (ref 1.15–1.40)
HCT: 34 % — ABNORMAL LOW (ref 36.0–46.0)
Hemoglobin: 11.6 g/dL — ABNORMAL LOW (ref 12.0–15.0)
O2 Saturation: 98 %
Potassium: 3.4 mmol/L — ABNORMAL LOW (ref 3.5–5.1)
Sodium: 137 mmol/L (ref 135–145)
TCO2: 21 mmol/L — ABNORMAL LOW (ref 22–32)
pCO2 arterial: 33 mmHg (ref 32.0–48.0)
pH, Arterial: 7.4 (ref 7.350–7.450)
pO2, Arterial: 99 mmHg (ref 83.0–108.0)

## 2020-12-27 LAB — URINALYSIS, ROUTINE W REFLEX MICROSCOPIC
Bilirubin Urine: NEGATIVE
Glucose, UA: NEGATIVE mg/dL
Hgb urine dipstick: NEGATIVE
Ketones, ur: 5 mg/dL — AB
Leukocytes,Ua: NEGATIVE
Nitrite: NEGATIVE
Protein, ur: NEGATIVE mg/dL
Specific Gravity, Urine: 1.011 (ref 1.005–1.030)
pH: 7 (ref 5.0–8.0)

## 2020-12-27 LAB — RAPID URINE DRUG SCREEN, HOSP PERFORMED
Amphetamines: NOT DETECTED
Barbiturates: NOT DETECTED
Benzodiazepines: NOT DETECTED
Cocaine: NOT DETECTED
Opiates: NOT DETECTED
Tetrahydrocannabinol: NOT DETECTED

## 2020-12-27 LAB — CBC WITH DIFFERENTIAL/PLATELET
Abs Immature Granulocytes: 0.03 10*3/uL (ref 0.00–0.07)
Basophils Absolute: 0 10*3/uL (ref 0.0–0.1)
Basophils Relative: 0 %
Eosinophils Absolute: 0 10*3/uL (ref 0.0–0.5)
Eosinophils Relative: 1 %
HCT: 32.7 % — ABNORMAL LOW (ref 36.0–46.0)
Hemoglobin: 10.1 g/dL — ABNORMAL LOW (ref 12.0–15.0)
Immature Granulocytes: 1 %
Lymphocytes Relative: 29 %
Lymphs Abs: 1.5 10*3/uL (ref 0.7–4.0)
MCH: 21.7 pg — ABNORMAL LOW (ref 26.0–34.0)
MCHC: 30.9 g/dL (ref 30.0–36.0)
MCV: 70.3 fL — ABNORMAL LOW (ref 80.0–100.0)
Monocytes Absolute: 0.8 10*3/uL (ref 0.1–1.0)
Monocytes Relative: 16 %
Neutro Abs: 2.7 10*3/uL (ref 1.7–7.7)
Neutrophils Relative %: 53 %
Platelets: 301 10*3/uL (ref 150–400)
RBC: 4.65 MIL/uL (ref 3.87–5.11)
RDW: 15.9 % — ABNORMAL HIGH (ref 11.5–15.5)
WBC: 5 10*3/uL (ref 4.0–10.5)
nRBC: 0 % (ref 0.0–0.2)

## 2020-12-27 LAB — COMPREHENSIVE METABOLIC PANEL
ALT: 11 U/L (ref 0–44)
AST: 32 U/L (ref 15–41)
Albumin: 3.4 g/dL — ABNORMAL LOW (ref 3.5–5.0)
Alkaline Phosphatase: 40 U/L (ref 38–126)
Anion gap: 8 (ref 5–15)
BUN: 5 mg/dL — ABNORMAL LOW (ref 6–20)
CO2: 19 mmol/L — ABNORMAL LOW (ref 22–32)
Calcium: 8.8 mg/dL — ABNORMAL LOW (ref 8.9–10.3)
Chloride: 106 mmol/L (ref 98–111)
Creatinine, Ser: 0.72 mg/dL (ref 0.44–1.00)
GFR, Estimated: >60 mL/min (ref >60)
Glucose, Bld: 91 mg/dL (ref 70–99)
Potassium: 4.3 mmol/L (ref 3.5–5.1)
Sodium: 133 mmol/L — ABNORMAL LOW (ref 135–145)
Total Bilirubin: 1.2 mg/dL (ref 0.3–1.2)
Total Protein: 7.3 g/dL (ref 6.5–8.1)

## 2020-12-27 LAB — RESP PANEL BY RT-PCR (FLU A&B, COVID) ARPGX2
Influenza A by PCR: POSITIVE — AB
Influenza B by PCR: NEGATIVE
SARS Coronavirus 2 by RT PCR: NEGATIVE

## 2020-12-27 LAB — HCG, QUANTITATIVE, PREGNANCY: hCG, Beta Chain, Quant, S: 4524 m[IU]/mL — ABNORMAL HIGH (ref <5)

## 2020-12-27 LAB — CBG MONITORING, ED: Glucose-Capillary: 90 mg/dL (ref 70–99)

## 2020-12-27 LAB — ACETAMINOPHEN LEVEL: Acetaminophen (Tylenol), Serum: <10 ug/mL — ABNORMAL LOW (ref 10–30)

## 2020-12-27 LAB — ETHANOL: Alcohol, Ethyl (B): <10 mg/dL (ref <10)

## 2020-12-27 LAB — LACTIC ACID, PLASMA: Lactic Acid, Venous: 0.9 mmol/L (ref 0.5–1.9)

## 2020-12-27 MED ORDER — LEVETIRACETAM 750 MG PO TABS: 750.0000 mg | ORAL_TABLET | Freq: Two times a day (BID) | ORAL | 0 refills | Status: DC

## 2020-12-27 MED ORDER — LEVETIRACETAM IN NACL 1500 MG/100ML IV SOLN
1500.0000 mg | Freq: Once | INTRAVENOUS | Status: AC
  Administered 2020-12-27: 1500 mg via INTRAVENOUS
  Filled 2020-12-27: qty 100

## 2020-12-27 MED ORDER — OSELTAMIVIR PHOSPHATE 75 MG PO CAPS: 75.0000 mg | ORAL_CAPSULE | Freq: Two times a day (BID) | ORAL | 0 refills | Status: DC

## 2020-12-27 MED ORDER — LORAZEPAM 2 MG/ML IJ SOLN
INTRAMUSCULAR | Status: AC
  Administered 2020-12-27: 2 mg via INTRAMUSCULAR
  Filled 2020-12-27: qty 1

## 2020-12-27 MED ORDER — LORAZEPAM 2 MG/ML IJ SOLN: 2.0000 mg | Freq: Once | INTRAMUSCULAR | Status: AC

## 2020-12-27 MED ORDER — LORAZEPAM 2 MG/ML IJ SOLN: 4.0000 mg | INTRAMUSCULAR | Status: DC | PRN

## 2020-12-27 MED ORDER — ONDANSETRON HCL 4 MG/2ML IJ SOLN
4.0000 mg | Freq: Once | INTRAMUSCULAR | Status: AC
  Administered 2020-12-27: 4 mg via INTRAVENOUS
  Filled 2020-12-27: qty 2

## 2020-12-27 NOTE — ED Notes (Signed)
Pt ambulated to restroom unassisted with no difficulty.

## 2020-12-27 NOTE — ED Notes (Signed)
Pt is now awake, alert and talking on the phone at this time. Waiting for keppra from pharmacy.

## 2020-12-27 NOTE — ED Notes (Signed)
Pt had 4 witnessed seizure episodes from the lobby. Hx of seizures. Seen here on 11/11 for seizures. Pt is [redacted] weeks pregnant at this time. Ativan 2mg  IM given. Cardiac monitoring in place. Will continue to monitor.

## 2020-12-27 NOTE — ED Notes (Signed)
Notified charge nurse of updated suicide assessment. Pt denies suicidality today and has been acting appropriately since her arrival to the dept. Currently safe on stretcher with seizure pads in place per seizure precautions protocol.

## 2020-12-27 NOTE — Discharge Instructions (Addendum)
I am giving you a new prescription for your Keppra.  Please take 750 mg twice per day.  Please make sure you follow-up with your neurologist next week at your scheduled appointment.  I am also prescribing you Tamiflu.  Please take this twice a day for the next 5 days.  This should help with your flu symptoms.  [facility] is [facility].  Please give them a call and schedule a follow-up appointment.  You need to have your quantitative hCG redrawn (blood work) and also have a follow-up ultrasound within 2 weeks.  If you develop any new or worsening symptoms please come back to the emergency department.

## 2020-12-27 NOTE — ED Triage Notes (Signed)
Hx of seizures. Pt had 4 witnessed seizures since arrival to ED. Observed with tonic clonic seizures by staff.

## 2020-12-27 NOTE — ED Notes (Signed)
Pt vomited in bed. Provider notified. RN and tech assisted pt in changing into a clean gown.
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
## 2020-12-30 LAB — LEVETIRACETAM LEVEL: Levetiracetam Lvl: <2 ug/mL — ABNORMAL LOW (ref 10.0–40.0)

## 2021-01-10 ENCOUNTER — Other Ambulatory Visit: Payer: Self-pay

## 2021-01-10 ENCOUNTER — Inpatient Hospital Stay (HOSPITAL_COMMUNITY)
Admission: AD | Admit: 2021-01-10 | Discharge: 2021-01-10 | Disposition: A | Discharge status: Home / Self Care | Payer: Medicaid - Out of State | Attending: Family Medicine | Admitting: Family Medicine

## 2021-01-10 DIAGNOSIS — Z3A01 Less than 8 weeks gestation of pregnancy: Secondary | ICD-10-CM | POA: Diagnosis not present

## 2021-01-10 DIAGNOSIS — Z3491 Encounter for supervision of normal pregnancy, unspecified, first trimester: Secondary | ICD-10-CM | POA: Diagnosis not present

## 2021-01-10 DIAGNOSIS — Z3490 Encounter for supervision of normal pregnancy, unspecified, unspecified trimester: Secondary | ICD-10-CM | POA: Diagnosis not present

## 2021-01-10 LAB — URINALYSIS, ROUTINE W REFLEX MICROSCOPIC
Bilirubin Urine: NEGATIVE
Glucose, UA: NEGATIVE mg/dL
Hgb urine dipstick: NEGATIVE
Ketones, ur: NEGATIVE mg/dL
Leukocytes,Ua: NEGATIVE
Nitrite: NEGATIVE
Protein, ur: NEGATIVE mg/dL
Specific Gravity, Urine: 1.025 (ref 1.005–1.030)
pH: 5 (ref 5.0–8.0)

## 2021-01-10 LAB — WET PREP, GENITAL
Clue Cells Wet Prep HPF POC: NONE SEEN
Sperm: NONE SEEN
Trich, Wet Prep: NONE SEEN
WBC, Wet Prep HPF POC: <10 (ref <10)
Yeast Wet Prep HPF POC: NONE SEEN

## 2021-01-10 NOTE — MAU Note (Signed)
Presents with c/o bilateral lower abdominal and back pain that began last week.  Denies VB.  Reports hasn't treated pain with any meds.

## 2021-01-10 NOTE — Discharge Instructions (Signed)

## 2021-01-10 NOTE — Progress Notes (Signed)
Knox Saliva, CNM into see pt and perform bedside U/S.

## 2021-01-10 NOTE — MAU Provider Note (Signed)
History     CSN: [id number]  Arrival date and time: 01/10/21 1333   Event Date/Time   First Provider Initiated Contact with Patient 01/10/21 1438      Chief Complaint  Patient presents with   Abdominal Pain   Back Pain   HPI Donna Guzman is a 20 y.o. G1P0 in first trimester who presents to MAU with chief complaints of suprapubic pain and low back pain. These are recurrent problems, onset last week. Her abdominal pain is 7/10. Her back pain is 5/10. She denies aggravating or alleviating factors. She has not taken medication or tried other treatments for this complaint. She denies vaginal bleeding, abnormal vaginal discharge, dysuria, fever or recent illness. She has not been sexually active recently and denies concern for STI exposure.  Patient has scheduled prenatal care with [facility].  OB History     Gravida  1   Para      Term      Preterm      AB      Living         SAB      IAB      Ectopic      Multiple      Live Births              Past Medical History:  Diagnosis Date   Seizures (HCC)     Past Surgical History:  Procedure Laterality Date   NO PAST SURGERIES      Family History  Problem Relation Age of Onset   Diabetes Mother    Hypertension Mother    Hypertension Father    ADD / ADHD Sister     Social History   Tobacco Use   Smoking status: Former    Types: Cigarettes    Quit date: 12/13/2020    Years since quitting: 0.0   Smokeless tobacco: Former  Building services engineer Use: Former  Substance Use Topics   Alcohol use: Not Currently   Drug use: Not Currently    Allergies: No Known Allergies  Medications Prior to Admission  Medication Sig Dispense Refill Last Dose   levETIRAcetam (KEPPRA) 750 MG tablet Take 1 tablet (750 mg total) by mouth 2 (two) times daily. 60 tablet 0    oseltamivir (TAMIFLU) 75 MG capsule Take 1 capsule (75 mg total) by mouth every 12 (twelve) hours. 10 capsule 0    Prenatal Vit-Fe Fumarate-FA  (PREPLUS) 27-1 MG TABS Take 1 tablet by mouth daily. (Patient not taking: Reported on 12/27/2020) 30 tablet 13     Review of Systems  Gastrointestinal:  Positive for abdominal pain.  Musculoskeletal:  Positive for back pain.  All other systems reviewed and are negative. Physical Exam   Blood pressure 111/68, pulse 71, temperature 98.4 F (36.9 C), temperature source Oral, resp. rate 20, height 5\' 2"  (1.575 m), weight 66 kg, last menstrual period 11/18/2020, SpO2 100 %.  Physical Exam Vitals and nursing note reviewed. Exam conducted with a chaperone present.  Constitutional:      Appearance: She is well-developed. She is not ill-appearing.  Cardiovascular:     Rate and Rhythm: Normal rate.     Heart sounds: Normal heart sounds.  Pulmonary:     Effort: Pulmonary effort is normal.     Breath sounds: Normal breath sounds.  Abdominal:     Palpations: Abdomen is soft.     Tenderness: There is no abdominal tenderness. There is no guarding or rebound.  Neurological:  Mental Status: She is alert.    MAU Course  Procedures  --GS + YS visualized on formal TVUS 12/27/2020 --Fetal pole with + cardiac flicker visualized on BSUS  Patient Vitals for the past 24 hrs:  BP Temp Temp src Pulse Resp SpO2 Height Weight  01/10/21 1429 111/68 98.4 F (36.9 C) Oral 71 20 100 % -- --  01/10/21 1424 -- -- -- -- -- -- 5\' 2"  (1.575 m) 66 kg   Results for orders placed or performed during the hospital encounter of 01/10/21 (from the past 24 hour(s))  Urinalysis, Routine w reflex microscopic Urine, Clean Catch     Status: Abnormal   Collection Time: 01/10/21  2:31 PM  Result Value Ref Range   Color, Urine YELLOW YELLOW   APPearance HAZY (A) CLEAR   Specific Gravity, Urine 1.025 1.005 - 1.030   pH 5.0 5.0 - 8.0   Glucose, UA NEGATIVE NEGATIVE mg/dL   Hgb urine dipstick NEGATIVE NEGATIVE   Bilirubin Urine NEGATIVE NEGATIVE   Ketones, ur NEGATIVE NEGATIVE mg/dL   Protein, ur NEGATIVE NEGATIVE  mg/dL   Nitrite NEGATIVE NEGATIVE   Leukocytes,Ua NEGATIVE NEGATIVE  Wet prep, genital     Status: None   Collection Time: 01/10/21  3:30 PM  Result Value Ref Range   Yeast Wet Prep HPF POC NONE SEEN NONE SEEN   Trich, Wet Prep NONE SEEN NONE SEEN   Clue Cells Wet Prep HPF POC NONE SEEN NONE SEEN   WBC, Wet Prep HPF POC <10 <10   Sperm NONE SEEN     Assessment and Plan  --20 y.o. G1P0 at [redacted]w[redacted]d  --+ cardiac flicker on BSUS --No acute concerns --Tylenol PRN for musculoskeletal pain --Discharge home in stable condition  [redacted]w[redacted]d, CNM 01/10/2021, 6:34 PM

## 2021-01-13 LAB — GC/CHLAMYDIA PROBE AMP (~~LOC~~) NOT AT ARMC
Chlamydia: NEGATIVE
Comment: NEGATIVE
Comment: NORMAL
Neisseria Gonorrhea: NEGATIVE

## 2021-01-19 ENCOUNTER — Inpatient Hospital Stay (HOSPITAL_COMMUNITY)

## 2021-01-19 ENCOUNTER — Other Ambulatory Visit: Payer: Self-pay

## 2021-01-19 ENCOUNTER — Encounter (HOSPITAL_COMMUNITY): Payer: Self-pay | Admitting: Obstetrics and Gynecology

## 2021-01-19 ENCOUNTER — Inpatient Hospital Stay (HOSPITAL_COMMUNITY)
Admission: AD | Admit: 2021-01-19 | Discharge: 2021-01-19 | Disposition: A | Discharge status: Home / Self Care | Attending: Obstetrics and Gynecology | Admitting: Obstetrics and Gynecology

## 2021-01-19 DIAGNOSIS — O418X10 Other specified disorders of amniotic fluid and membranes, first trimester, not applicable or unspecified: Secondary | ICD-10-CM

## 2021-01-19 DIAGNOSIS — M549 Dorsalgia, unspecified: Secondary | ICD-10-CM | POA: Insufficient documentation

## 2021-01-19 DIAGNOSIS — O208 Other hemorrhage in early pregnancy: Secondary | ICD-10-CM | POA: Diagnosis not present

## 2021-01-19 DIAGNOSIS — Z3A08 8 weeks gestation of pregnancy: Secondary | ICD-10-CM | POA: Diagnosis not present

## 2021-01-19 DIAGNOSIS — O26891 Other specified pregnancy related conditions, first trimester: Secondary | ICD-10-CM | POA: Insufficient documentation

## 2021-01-19 DIAGNOSIS — Z3490 Encounter for supervision of normal pregnancy, unspecified, unspecified trimester: Secondary | ICD-10-CM | POA: Diagnosis not present

## 2021-01-19 DIAGNOSIS — O26851 Spotting complicating pregnancy, first trimester: Secondary | ICD-10-CM

## 2021-01-19 DIAGNOSIS — O200 Threatened abortion: Secondary | ICD-10-CM

## 2021-01-19 DIAGNOSIS — O99891 Other specified diseases and conditions complicating pregnancy: Secondary | ICD-10-CM

## 2021-01-19 LAB — CBC
HCT: 28.7 % — ABNORMAL LOW (ref 36.0–46.0)
Hemoglobin: 9 g/dL — ABNORMAL LOW (ref 12.0–15.0)
MCH: 21.6 pg — ABNORMAL LOW (ref 26.0–34.0)
MCHC: 31.4 g/dL (ref 30.0–36.0)
MCV: 68.8 fL — ABNORMAL LOW (ref 80.0–100.0)
Platelets: 336 10*3/uL (ref 150–400)
RBC: 4.17 MIL/uL (ref 3.87–5.11)
RDW: 15 % (ref 11.5–15.5)
WBC: 7.7 10*3/uL (ref 4.0–10.5)
nRBC: 0 % (ref 0.0–0.2)

## 2021-01-19 LAB — URINALYSIS, ROUTINE W REFLEX MICROSCOPIC
Bilirubin Urine: NEGATIVE
Glucose, UA: NEGATIVE mg/dL
Hgb urine dipstick: NEGATIVE
Ketones, ur: NEGATIVE mg/dL
Leukocytes,Ua: NEGATIVE
Nitrite: NEGATIVE
Protein, ur: NEGATIVE mg/dL
Specific Gravity, Urine: 1.025 (ref 1.005–1.030)
pH: 6.5 (ref 5.0–8.0)

## 2021-01-19 LAB — HCG, QUANTITATIVE, PREGNANCY: hCG, Beta Chain, Quant, S: 84216 m[IU]/mL — ABNORMAL HIGH (ref <5)

## 2021-01-19 LAB — WET PREP, GENITAL
Sperm: NONE SEEN
Trich, Wet Prep: NONE SEEN
WBC, Wet Prep HPF POC: <10 (ref <10)
Yeast Wet Prep HPF POC: NONE SEEN

## 2021-01-19 LAB — HIV ANTIBODY (ROUTINE TESTING W REFLEX): HIV Screen 4th Generation wRfx: NONREACTIVE

## 2021-01-19 MED ORDER — PROMETHAZINE HCL 12.5 MG PO TABS: 12.5000 mg | ORAL_TABLET | Freq: Four times a day (QID) | ORAL | 0 refills | Status: DC | PRN
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
## 2021-01-20 ENCOUNTER — Ambulatory Visit (INDEPENDENT_AMBULATORY_CARE_PROVIDER_SITE_OTHER): Admitting: *Deleted

## 2021-01-20 VITALS — BP 110/65 | HR 92 | Temp 98.7°F | Ht 62.5 in | Wt 146.8 lb

## 2021-01-20 DIAGNOSIS — O26839 Pregnancy related renal disease, unspecified trimester: Secondary | ICD-10-CM

## 2021-01-20 DIAGNOSIS — Z3A09 9 weeks gestation of pregnancy: Secondary | ICD-10-CM

## 2021-01-20 LAB — GC/CHLAMYDIA PROBE AMP (~~LOC~~) NOT AT ARMC
Chlamydia: NEGATIVE
Comment: NEGATIVE
Comment: NORMAL
Neisseria Gonorrhea: NEGATIVE

## 2021-01-20 LAB — ABO/RH: ABO/RH(D): A POS

## 2021-01-20 NOTE — Progress Notes (Signed)
° °  Patient left appointment, she felt the office was not doing anything for her. Advised patient that [facility] usually see patients between 10-12 weeks. A nurse intake visit is done first than a visit with provider. Depending on her gestation age, heart tones are done. Patient is only [redacted] weeks gestation per self. Advised heart tones are not usually heard with doppler until after 10 weeks. Patient opt to leave and get scheduled at another office.  Clovis Pu, RN

## 2021-02-01 ENCOUNTER — Other Ambulatory Visit: Payer: Self-pay

## 2021-02-01 ENCOUNTER — Inpatient Hospital Stay (HOSPITAL_COMMUNITY)
Admission: AD | Admit: 2021-02-01 | Discharge: 2021-02-01 | Disposition: A | Discharge status: Home / Self Care | Attending: Obstetrics & Gynecology | Admitting: Obstetrics & Gynecology

## 2021-02-01 ENCOUNTER — Inpatient Hospital Stay (HOSPITAL_COMMUNITY)

## 2021-02-01 ENCOUNTER — Encounter (HOSPITAL_COMMUNITY): Payer: Self-pay | Admitting: Obstetrics & Gynecology

## 2021-02-01 DIAGNOSIS — O3481 Maternal care for other abnormalities of pelvic organs, first trimester: Secondary | ICD-10-CM | POA: Insufficient documentation

## 2021-02-01 DIAGNOSIS — O26899 Other specified pregnancy related conditions, unspecified trimester: Secondary | ICD-10-CM | POA: Diagnosis not present

## 2021-02-01 DIAGNOSIS — N83209 Unspecified ovarian cyst, unspecified side: Secondary | ICD-10-CM

## 2021-02-01 DIAGNOSIS — O26891 Other specified pregnancy related conditions, first trimester: Secondary | ICD-10-CM | POA: Diagnosis present

## 2021-02-01 DIAGNOSIS — N83202 Unspecified ovarian cyst, left side: Secondary | ICD-10-CM | POA: Diagnosis not present

## 2021-02-01 DIAGNOSIS — R109 Unspecified abdominal pain: Secondary | ICD-10-CM

## 2021-02-01 DIAGNOSIS — Z3A10 10 weeks gestation of pregnancy: Secondary | ICD-10-CM | POA: Diagnosis not present

## 2021-02-01 LAB — URINALYSIS, ROUTINE W REFLEX MICROSCOPIC
Bilirubin Urine: NEGATIVE
Glucose, UA: NEGATIVE mg/dL
Hgb urine dipstick: NEGATIVE
Ketones, ur: NEGATIVE mg/dL
Leukocytes,Ua: NEGATIVE
Nitrite: NEGATIVE
Protein, ur: NEGATIVE mg/dL
Specific Gravity, Urine: 1.025 (ref 1.005–1.030)
pH: 7 (ref 5.0–8.0)

## 2021-02-01 MED ORDER — ONDANSETRON 4 MG PO TBDP
8.0000 mg | ORAL_TABLET | Freq: Once | ORAL | Status: AC
  Administered 2021-02-01: 8 mg via ORAL
  Filled 2021-02-01: qty 2

## 2021-02-01 MED ORDER — ACETAMINOPHEN 500 MG PO TABS
1000.0000 mg | ORAL_TABLET | Freq: Once | ORAL | Status: AC
  Administered 2021-02-01: 1000 mg via ORAL
  Filled 2021-02-01: qty 2

## 2021-02-01 MED ORDER — ONDANSETRON HCL 4 MG PO TABS: 4.0000 mg | ORAL_TABLET | Freq: Every day | ORAL | 1 refills | Status: DC | PRN

## 2021-02-01 MED ORDER — TRAMADOL HCL 50 MG PO TABS: 50.0000 mg | ORAL_TABLET | Freq: Four times a day (QID) | ORAL | 0 refills | Status: DC | PRN

## 2021-02-01 NOTE — MAU Note (Signed)
Donna Guzman is a 20 y.o. at [redacted]w[redacted]d here in MAU reporting: last night started having upper back and left lower abdominal pain. Pain comes and goes. Denies bleeding or discharge.  Onset of complaint: last night  Pain score: back 7/10, abdomen 8/10  Lab orders placed from triage: UA

## 2021-02-01 NOTE — MAU Provider Note (Signed)
History     CSN: [id number]  Arrival date and time: 02/01/21 1043   Event Date/Time   First Provider Initiated Contact with Patient 02/01/21 1224      Chief Complaint  Patient presents with   Back Pain   Abdominal Pain   HPI  Ms.Donna Guzman is a 20 y.o. female G1P0 @ [redacted]w[redacted]d here in MAU with complaints of abdominal pain. The pain started last night. The pain is located in the LLQ. At times she feels pain in her left upper back. The pain comes and goes. This is a new pain. She has not tried anything for the pain. She has no bleeding.   OB History     Gravida  1   Para      Term      Preterm      AB      Living         SAB      IAB      Ectopic      Multiple      Live Births              Past Medical History:  Diagnosis Date   Seizures ([place])     Past Surgical History:  Procedure Laterality Date   LACERATION REPAIR     forehead    Family History  Problem Relation Age of Onset   Diabetes Mother    Hypertension Mother    Hypertension Father    ADD / ADHD Sister     Social History   Tobacco Use   Smoking status: Former    Types: Cigarettes    Quit date: 12/13/2020    Years since quitting: 0.1   Smokeless tobacco: Former  Scientific laboratory technician Use: Former  Substance Use Topics   Alcohol use: Not Currently   Drug use: Not Currently    Allergies: No Known Allergies  Medications Prior to Admission  Medication Sig Dispense Refill Last Dose   levETIRAcetam (KEPPRA) 750 MG tablet Take 1 tablet (750 mg total) by mouth 2 (two) times daily. 60 tablet 0    oseltamivir (TAMIFLU) 75 MG capsule Take 1 capsule (75 mg total) by mouth every 12 (twelve) hours. 10 capsule 0    Prenatal Vit-Fe Fumarate-FA (PREPLUS) 27-1 MG TABS Take 1 tablet by mouth daily. (Patient not taking: Reported on 12/27/2020) 30 tablet 13    promethazine (PHENERGAN) 12.5 MG tablet Take 1 tablet (12.5 mg total) by mouth every 6 (six) hours as needed for nausea or vomiting. 30  tablet 0    Results for orders placed or performed during the hospital encounter of 02/01/21 (from the past 48 hour(s))  Urinalysis, Routine w reflex microscopic Urine, Clean Catch     Status: Abnormal   Collection Time: 02/01/21 11:18 AM  Result Value Ref Range   Color, Urine YELLOW YELLOW   APPearance HAZY (A) CLEAR   Specific Gravity, Urine 1.025 1.005 - 1.030   pH 7.0 5.0 - 8.0   Glucose, UA NEGATIVE NEGATIVE mg/dL   Hgb urine dipstick NEGATIVE NEGATIVE   Bilirubin Urine NEGATIVE NEGATIVE   Ketones, ur NEGATIVE NEGATIVE mg/dL   Protein, ur NEGATIVE NEGATIVE mg/dL   Nitrite NEGATIVE NEGATIVE   Leukocytes,Ua NEGATIVE NEGATIVE    Comment: Performed at [facility] [address] [place], [place] [address]    [place] Transvaginal  Result Date: 02/01/2021 CLINICAL DATA:  Abdominal pain.  Ovarian cyst. EXAM: OBSTETRIC <14 WK ULTRASOUND  TECHNIQUE: Transabdominal ultrasound was performed for evaluation of the gestation as well as the maternal uterus and adnexal regions. COMPARISON:  01/19/2021 FINDINGS: Intrauterine gestational sac: Single Yolk sac:  Visualized. Embryo:  Visualized. Cardiac Activity: Visualized. Heart Rate: 177 bpm CRL:   39.2 mm   10 w 6 d                  [place] EDC: 08/24/2021 Subchorionic hemorrhage:  None visualized. Maternal uterus/adnexae: No uterine mass.  Cervix is closed. Hypoechoic left ovarian mass with surrounding blood flow. This measures 3.0 x 2.6 x 2.5 cm. Adjacent simple appearing left ovarian cyst measures 2.6 x 2.2 x 2.2 cm. These findings are essentially stable from the prior ultrasound. No left adnexal mass. Color Doppler arterial and venous blood flow seen to the left ovarian tissue. Normal right ovary. IMPRESSION: 1. No significant change in left ovarian mass with low level echoes consistent complicated cyst, still measuring 3 cm in greatest dimension. Recommend follow-up ultrasound in 6-8 weeks to reassess. 2. Persistent 2.6 cm simple appearing left  ovarian cyst. No followup imaging recommended. Note: This recommendation does not apply to premenarchal patients or to those with increased risk (genetic, family history, elevated tumor markers or other high-risk factors) of ovarian cancer. Reference: Radiology 2019 Nov; 293(2):359-371. 3. Single live intrauterine pregnancy with a measured gestational age of [redacted] weeks and 6 days, demonstrating normal progression since the prior ultrasound Electronically Signed   By: Amie Portland M.D.   On: 02/01/2021 14:10     Review of Systems  Constitutional:  Negative for fever.  Gastrointestinal:  Positive for abdominal pain and nausea. Negative for vomiting.  Genitourinary:  Negative for vaginal bleeding and vaginal discharge.  Physical Exam   Blood pressure 113/65, pulse 84, temperature 98.6 F (37 C), temperature source Oral, resp. rate 16, last menstrual period 11/18/2020.  Physical Exam Vitals and nursing note reviewed.  Constitutional:      General: She is not in acute distress.    Appearance: She is well-developed. She is not ill-appearing, toxic-appearing or diaphoretic.  Abdominal:     Tenderness: There is abdominal tenderness in the suprapubic area and left lower quadrant. There is no left CVA tenderness, guarding or rebound.  Skin:    General: Skin is warm.  Neurological:     Mental Status: She is alert and oriented to person, place, and time.   MAU Course  Procedures Pt informed that the ultrasound is considered a limited OB ultrasound and is not intended to be a complete ultrasound exam.  Patient also informed that the ultrasound is not being completed with the intent of assessing for fetal or placental anomalies or any pelvic abnormalities.  Explained that the purpose of todays ultrasound is to assess for  viability.  Patient acknowledges the purpose of the exam and the limitations of the study.     MDM  Tylenol given 1 gram & Zofran for nausea. Discussed [place] in detail with patient.  Ovarian cyst unchanged from previous US. Pain decreased with tylenol.   Assessment and Plan   A:  1. [redacted] weeks gestation of pregnancy   2. Ovarian cyst   3. Abdominal pain affecting pregnancy      P:  Discharge home Rx: Zofran, Tramadol for severe pain #5 no refill Ok to use tylenol OTC as directed on the bottle. Return to MAU if symptoms worsen Keep OB appointment.   Venia Carbon I, NP 02/01/2021 6:03 PM

## 2021-02-10 ENCOUNTER — Ambulatory Visit (INDEPENDENT_AMBULATORY_CARE_PROVIDER_SITE_OTHER): Payer: Medicaid - Out of State | Admitting: Obstetrics and Gynecology

## 2021-02-10 ENCOUNTER — Other Ambulatory Visit (HOSPITAL_COMMUNITY)
Admission: RE | Admit: 2021-02-10 | Discharge: 2021-02-10 | Disposition: A | Discharge status: Home / Self Care | Source: Ambulatory Visit | Attending: Obstetrics and Gynecology | Admitting: Obstetrics and Gynecology

## 2021-02-10 ENCOUNTER — Other Ambulatory Visit: Payer: Self-pay

## 2021-02-10 ENCOUNTER — Encounter: Payer: Self-pay | Admitting: Obstetrics and Gynecology

## 2021-02-10 VITALS — BP 118/70 | HR 74 | Wt 144.2 lb

## 2021-02-10 DIAGNOSIS — Z3491 Encounter for supervision of normal pregnancy, unspecified, first trimester: Secondary | ICD-10-CM

## 2021-02-10 DIAGNOSIS — Z3490 Encounter for supervision of normal pregnancy, unspecified, unspecified trimester: Secondary | ICD-10-CM | POA: Insufficient documentation

## 2021-02-10 DIAGNOSIS — N83209 Unspecified ovarian cyst, unspecified side: Secondary | ICD-10-CM | POA: Insufficient documentation

## 2021-02-10 DIAGNOSIS — N83202 Unspecified ovarian cyst, left side: Secondary | ICD-10-CM

## 2021-02-10 DIAGNOSIS — R569 Unspecified convulsions: Secondary | ICD-10-CM

## 2021-02-10 NOTE — Progress Notes (Signed)
Subjective:  Donna Guzman is a 21 y.o. G2P0010 at [redacted]w[redacted]d being seen today for her first OB first. EDD by LMP and confirmed by first trimester U/S. Sz H/O, stable on medication and followed by Neurology.   She is currently monitored for the following issues for this high-risk pregnancy and has Seizure ([place]); Suicidal ideation; Supervision of low-risk pregnancy; and Ovarian cyst on their problem list.  Patient reports no complaints.  Contractions: Not present. Vag. Bleeding: None.  Movement: Present. Denies leaking of fluid.   The following portions of the patient's history were reviewed and updated as appropriate: allergies, current medications, past family history, past medical history, past social history, past surgical history and problem list. Problem list updated.  Objective:   Vitals:   02/10/21 1355  BP: 118/70  Pulse: 74  Weight: 144 lb 3.2 oz (65.4 kg)    Fetal Status: Fetal Heart Rate (bpm): 181   Movement: Present     General:  Alert, oriented and cooperative. Patient is in no acute distress.  Skin: Skin is warm and dry. No rash noted.   Cardiovascular: Normal heart rate noted  Respiratory: Normal respiratory effort, no problems with respiration noted  Abdomen: Soft, gravid, appropriate for gestational age. Pain/Pressure: Present     Pelvic:  Cervical exam performed        Extremities: Normal range of motion.  Edema: None  Mental Status: Normal mood and affect. Normal behavior. Normal judgment and thought content.   Urinalysis:      Assessment and Plan:  Pregnancy: G2P0010 at [redacted]w[redacted]d  1. Encounter for supervision of low-risk pregnancy in first trimester Prenatal care and labs reviewed with pt. Genetic testing discussed - CBC/D/Plt+RPR+Rh+ABO+RubIgG... - [place] MFM OB COMP + 14 WK; Future - [facility] AMB BABYSCRIPTS SCHEDULE OPTIMIZATION - Culture, OB Urine - Hemoglobin A1c - Cytology - PAP( [place]) - AMBULATORY REFERRAL TO [facility]  2. Seizure  (HCC) Stable Continue with Keppra Followed by neurology  3. Cyst of left ovary F/U with anatomy scan  Preterm labor symptoms and general obstetric precautions including but not limited to vaginal bleeding, contractions, leaking of fluid and fetal movement were reviewed in detail with the patient. Please refer to After Visit Summary for other counseling recommendations.  Return in about 4 weeks (around 03/10/2021) for OB visit, face to face, any provider.   Chancy Milroy, MD

## 2021-02-10 NOTE — Patient Instructions (Signed)
First Trimester of Pregnancy °The first trimester of pregnancy starts on the first day of your last menstrual period until the end of week 12. This is months 1 through 3 of pregnancy. A week after a sperm fertilizes an egg, the egg will implant into the wall of the uterus and begin to develop into a baby. By the end of 12 weeks, all the baby's organs will be formed and the baby will be 2-3 inches in size. °Body changes during your first trimester °Your body goes through many changes during pregnancy. The changes vary and generally return to normal after your baby is born. °Physical changes °You may gain or lose weight. °Your breasts may begin to grow larger and become tender. The tissue that surrounds your nipples (areola) may become darker. °Dark spots or blotches (chloasma or mask of pregnancy) may develop on your face. °You may have changes in your hair. These can include thickening or thinning of your hair or changes in texture. °Health changes °You may feel nauseous, and you may vomit. °You may have heartburn. °You may develop headaches. °You may develop constipation. °Your gums may bleed and may be sensitive to brushing and flossing. °Other changes °You may tire easily. °You may urinate more often. °Your menstrual periods will stop. °You may have a loss of appetite. °You may develop cravings for certain kinds of food. °You may have changes in your emotions from day to day. °You may have more vivid and strange dreams. °Follow these instructions at home: °Medicines °Follow your health care provider's instructions regarding medicine use. Specific medicines may be either safe or unsafe to take during pregnancy. Do not take any medicines unless told to by your health care provider. °Take a prenatal vitamin that contains at least 600 micrograms (mcg) of folic acid. °Eating and drinking °Eat a healthy diet that includes fresh fruits and vegetables, whole grains, good sources of protein such as meat, eggs, or tofu,  and low-fat dairy products. °Avoid raw meat and unpasteurized juice, milk, and cheese. These carry germs that can harm you and your baby. °If you feel nauseous or you vomit: °Eat 4 or 5 small meals a day instead of 3 large meals. °Try eating a few soda crackers. °Drink liquids between meals instead of during meals. °You may need to take these actions to prevent or treat constipation: °Drink enough fluid to keep your urine pale yellow. °Eat foods that are high in fiber, such as beans, whole grains, and fresh fruits and vegetables. °Limit foods that are high in fat and processed sugars, such as fried or sweet foods. °Activity °Exercise only as directed by your health care provider. Most people can continue their usual exercise routine during pregnancy. Try to exercise for 30 minutes at least 5 days a week. °Stop exercising if you develop pain or cramping in the lower abdomen or lower back. °Avoid exercising if it is very hot or humid or if you are at high altitude. °Avoid heavy lifting. °If you choose to, you may have sex unless your health care provider tells you not to. °Relieving pain and discomfort °Wear a good support bra to relieve breast tenderness. °Rest with your legs elevated if you have leg cramps or low back pain. °If you develop bulging veins (varicose veins) in your legs: °Wear support hose as told by your health care provider. °Elevate your feet for 15 minutes, 3-4 times a day. °Limit salt in your diet. °Safety °Wear your seat belt at all times when driving   or riding in a car. °Talk with your health care provider if someone is verbally or physically abusive to you. °Talk with your health care provider if you are feeling sad or have thoughts of hurting yourself. °Lifestyle °Do not use hot tubs, steam rooms, or saunas. °Do not douche. Do not use tampons or scented sanitary pads. °Do not use herbal remedies, alcohol, illegal drugs, or medicines that are not approved by your health care provider. Chemicals  in these products can harm your baby. °Do not use any products that contain nicotine or tobacco, such as cigarettes, e-cigarettes, and chewing tobacco. If you need help quitting, ask your health care provider. °Avoid cat litter boxes and soil used by cats. These carry germs that can cause birth defects in the baby and possibly loss of the unborn baby (fetus) by miscarriage or stillbirth. °General instructions °During routine prenatal visits in the first trimester, your health care provider will do a physical exam, perform necessary tests, and ask you how things are going. Keep all follow-up visits. This is important. °Ask for help if you have counseling or nutritional needs during pregnancy. Your health care provider can offer advice or refer you to specialists for help with various needs. °Schedule a dentist appointment. At home, brush your teeth with a soft toothbrush. Floss gently. °Write down your questions. Take them to your prenatal visits. °Where to find more information °American Pregnancy Association: americanpregnancy.org °American College of Obstetricians and Gynecologists: acog.org/en/Womens%20Health/Pregnancy °Office on Women's Health: womenshealth.gov/pregnancy °Contact a health care provider if you have: °Dizziness. °A fever. °Mild pelvic cramps, pelvic pressure, or nagging pain in the abdominal area. °Nausea, vomiting, or diarrhea that lasts for 24 hours or longer. °A bad-smelling vaginal discharge. °Pain when you urinate. °Known exposure to a contagious illness, such as chickenpox, measles, Zika virus, HIV, or hepatitis. °Get help right away if you have: °Spotting or bleeding from your vagina. °Severe abdominal cramping or pain. °Shortness of breath or chest pain. °Any kind of trauma, such as from a fall or a car crash. °New or increased pain, swelling, or redness in an arm or leg. °Summary °The first trimester of pregnancy starts on the first day of your last menstrual period until the end of week  12 (months 1 through 3). °Eating 4 or 5 small meals a day rather than 3 large meals may help to relieve nausea and vomiting. °Do not use any products that contain nicotine or tobacco, such as cigarettes, e-cigarettes, and chewing tobacco. If you need help quitting, ask your health care provider. °Keep all follow-up visits. This is important. °This information is not intended to replace advice given to you by your health care provider. Make sure you discuss any questions you have with your health care provider. °Document Revised: 06/28/2019 Document Reviewed: 05/04/2019 °Elsevier Patient Education © 2022 [employer]vier Inc. ° °

## 2021-02-11 LAB — CBC/D/PLT+RPR+RH+ABO+RUBIGG...
Antibody Screen: NEGATIVE
Basophils Absolute: 0 10*3/uL (ref 0.0–0.2)
Basos: 0 %
EOS (ABSOLUTE): 0.1 10*3/uL (ref 0.0–0.4)
Eos: 1 %
HCV Ab: <0.1 s/co ratio (ref 0.0–0.9)
HIV Screen 4th Generation wRfx: NONREACTIVE
Hematocrit: 33.6 % — ABNORMAL LOW (ref 34.0–46.6)
Hemoglobin: 10.1 g/dL — ABNORMAL LOW (ref 11.1–15.9)
Hepatitis B Surface Ag: NEGATIVE
Immature Grans (Abs): 0 10*3/uL (ref 0.0–0.1)
Immature Granulocytes: 0 %
Lymphocytes Absolute: 1.8 10*3/uL (ref 0.7–3.1)
Lymphs: 25 %
MCH: 21.9 pg — ABNORMAL LOW (ref 26.6–33.0)
MCHC: 30.1 g/dL — ABNORMAL LOW (ref 31.5–35.7)
MCV: 73 fL — ABNORMAL LOW (ref 79–97)
Monocytes Absolute: 0.6 10*3/uL (ref 0.1–0.9)
Monocytes: 8 %
Neutrophils Absolute: 4.6 10*3/uL (ref 1.4–7.0)
Neutrophils: 66 %
Platelets: 378 10*3/uL (ref 150–450)
RBC: 4.61 x10E6/uL (ref 3.77–5.28)
RDW: 14.8 % (ref 11.7–15.4)
RPR Ser Ql: NONREACTIVE
Rh Factor: POSITIVE
Rubella Antibodies, IGG: <0.9 index — ABNORMAL LOW (ref >0.99)
WBC: 7.1 10*3/uL (ref 3.4–10.8)

## 2021-02-11 LAB — HEMOGLOBIN A1C
Est. average glucose Bld gHb Est-mCnc: 97 mg/dL
Hgb A1c MFr Bld: 5 % (ref 4.8–5.6)

## 2021-02-11 LAB — HCV INTERPRETATION

## 2021-02-12 ENCOUNTER — Encounter: Payer: Self-pay | Admitting: *Deleted

## 2021-02-12 LAB — CYTOLOGY - PAP
Chlamydia: NEGATIVE
Comment: NEGATIVE
Comment: NORMAL
Diagnosis: NEGATIVE
Diagnosis: REACTIVE
Neisseria Gonorrhea: NEGATIVE

## 2021-02-12 LAB — URINE CULTURE, OB REFLEX

## 2021-02-12 LAB — CULTURE, OB URINE

## 2021-02-14 ENCOUNTER — Other Ambulatory Visit: Payer: Self-pay

## 2021-02-14 ENCOUNTER — Emergency Department (HOSPITAL_COMMUNITY)
Admission: EM | Admit: 2021-02-14 | Discharge: 2021-02-14 | Disposition: A | Discharge status: Home / Self Care | Attending: Emergency Medicine | Admitting: Emergency Medicine

## 2021-02-14 ENCOUNTER — Encounter (HOSPITAL_COMMUNITY): Payer: Self-pay

## 2021-02-14 DIAGNOSIS — R569 Unspecified convulsions: Secondary | ICD-10-CM

## 2021-02-14 DIAGNOSIS — Z3A12 12 weeks gestation of pregnancy: Secondary | ICD-10-CM | POA: Diagnosis not present

## 2021-02-14 DIAGNOSIS — R519 Headache, unspecified: Secondary | ICD-10-CM | POA: Diagnosis not present

## 2021-02-14 DIAGNOSIS — O26891 Other specified pregnancy related conditions, first trimester: Secondary | ICD-10-CM | POA: Diagnosis present

## 2021-02-14 LAB — CBC WITH DIFFERENTIAL/PLATELET
Abs Immature Granulocytes: 0.02 10*3/uL (ref 0.00–0.07)
Basophils Absolute: 0 10*3/uL (ref 0.0–0.1)
Basophils Relative: 1 %
Eosinophils Absolute: 0.1 10*3/uL (ref 0.0–0.5)
Eosinophils Relative: 1 %
HCT: 28.2 % — ABNORMAL LOW (ref 36.0–46.0)
Hemoglobin: 8.9 g/dL — ABNORMAL LOW (ref 12.0–15.0)
Immature Granulocytes: 0 %
Lymphocytes Relative: 25 %
Lymphs Abs: 1.9 10*3/uL (ref 0.7–4.0)
MCH: 22.3 pg — ABNORMAL LOW (ref 26.0–34.0)
MCHC: 31.6 g/dL (ref 30.0–36.0)
MCV: 70.7 fL — ABNORMAL LOW (ref 80.0–100.0)
Monocytes Absolute: 0.8 10*3/uL (ref 0.1–1.0)
Monocytes Relative: 10 %
Neutro Abs: 4.9 10*3/uL (ref 1.7–7.7)
Neutrophils Relative %: 63 %
Platelets: 298 10*3/uL (ref 150–400)
RBC: 3.99 MIL/uL (ref 3.87–5.11)
RDW: 14.5 % (ref 11.5–15.5)
WBC: 7.7 10*3/uL (ref 4.0–10.5)
nRBC: 0 % (ref 0.0–0.2)

## 2021-02-14 LAB — BASIC METABOLIC PANEL
Anion gap: 10 (ref 5–15)
BUN: 7 mg/dL (ref 6–20)
CO2: 21 mmol/L — ABNORMAL LOW (ref 22–32)
Calcium: 9.6 mg/dL (ref 8.9–10.3)
Chloride: 103 mmol/L (ref 98–111)
Creatinine, Ser: 0.55 mg/dL (ref 0.44–1.00)
GFR, Estimated: >60 mL/min (ref >60)
Glucose, Bld: 81 mg/dL (ref 70–99)
Potassium: 3.4 mmol/L — ABNORMAL LOW (ref 3.5–5.1)
Sodium: 134 mmol/L — ABNORMAL LOW (ref 135–145)

## 2021-02-14 MED ORDER — SODIUM CHLORIDE 0.9 % IV BOLUS
1000.0000 mL | Freq: Once | INTRAVENOUS | Status: AC
  Administered 2021-02-14: 1000 mL via INTRAVENOUS

## 2021-02-14 MED ORDER — LEVETIRACETAM 500 MG PO TABS: 500.0000 mg | ORAL_TABLET | Freq: Two times a day (BID) | ORAL | 0 refills | Status: DC

## 2021-02-14 MED ORDER — LEVETIRACETAM IN NACL 1000 MG/100ML IV SOLN
1000.0000 mg | Freq: Once | INTRAVENOUS | Status: AC
  Administered 2021-02-14: 1000 mg via INTRAVENOUS
  Filled 2021-02-14: qty 100

## 2021-02-14 MED ORDER — SODIUM CHLORIDE 0.9 % IV BOLUS
1000.0000 mL | Freq: Once | INTRAVENOUS | Status: AC
Start: 2021-02-14 — End: 2021-02-14
  Administered 2021-02-14: 1000 mL via INTRAVENOUS
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
## 2021-02-14 NOTE — ED Provider Notes (Signed)
[facility] EMERGENCY DEPARTMENT Provider Note   CSN: [id number] Arrival date & time: 02/14/21  0058     History  Chief Complaint  Patient presents with   Seizures    Donna Guzman is a 21 y.o. female.  The history is provided by the EMS personnel and the patient.  Seizures Seizure activity on arrival: no   Postictal symptoms: confusion and memory loss   Return to baseline: yes   Severity:  Moderate Progression:  Improving History of seizures: yes   Patient with known history of seizures on Keppra, as well as pregnancy presents with seizure.  Patient arrived via EMS.  It is reported that she was found on the ground with a witnessed seizure by family.  Patient was awake on EMS arrival and not require any medications.  Patient has been confused and amnestic to the event.  She reports her last seizure was about a month ago.  She reports she has  only been taking at least 1 dose of Keppra a day due to feeling sick with her pregnancy.  She reports she is [redacted] weeks pregnant. She reports mild posterior headache and low back soreness since the seizure.  No new abdominal pain or vaginal bleeding.    Home Medications Prior to Admission medications   Medication Sig Start Date End Date Taking? Authorizing Provider  levETIRAcetam (KEPPRA) 500 MG tablet Take 1 tablet (500 mg total) by mouth 2 (two) times daily. 02/14/21  Yes Ripley Fraise, MD  Prenatal MV & Min w/FA-DHA (PRENATAL GUMMIES PO) Take 1 tablet by mouth daily.   Yes [provider]  Prenatal Vit-Fe Fumarate-FA (PREPLUS) 27-1 MG TABS Take 1 tablet by mouth daily. Patient not taking: Reported on 02/14/2021 12/18/20   Darlina Rumpf, CNM      Allergies    Patient has no known allergies.    Review of Systems   Review of Systems  Constitutional:  Negative for fever.  Cardiovascular:  Negative for chest pain.  Gastrointestinal:  Negative for abdominal pain.  Genitourinary:  Negative for vaginal  bleeding.  Musculoskeletal:  Positive for back pain.  Neurological:  Positive for seizures and headaches.  All other systems reviewed and are negative.  Physical Exam Updated Vital Signs BP 119/69    Pulse 78    Temp 98.7 F (37.1 C) (Oral)    Resp 19    Ht 1.575 m (_0 )    Wt 65.3 kg    LMP 11/18/2020    SpO2 100%    BMI 26.34 kg/m  Physical Exam CONSTITUTIONAL: Well developed/well nourished HEAD: Normocephalic/atraumatic Tenderness noted to right occiput without any hematoma.  No lacerations.  No other tenderness to her scalp. EYES: EOMI/PERRL ENMT: Mucous membranes moist NECK: supple no meningeal signs SPINE/BACK: No cervical or thoracic tenderness.  Nexus criteria met.  Mild lumbar spinal and paraspinal tenderness noted. No bruising/crepitance/stepoffs noted to spine CV: S1/S2 noted, no murmurs/rubs/gallops noted LUNGS: Lungs are clear to auscultation bilaterally, no apparent distress ABDOMEN: soft, nontender, no rebound or guarding, bowel sounds noted throughout abdomen GU:no cva tenderness NEURO: Pt is awake/alert/appropriate, moves all extremitiesx4.  No facial droop.  GCS 15 EXTREMITIES: pulses normal/equal, full ROM, all other extremities/joints palpated/ranged and nontender SKIN: warm, color normal PSYCH: no abnormalities of mood noted, alert and oriented to situation  ED Results / Procedures / Treatments   Labs (all labs ordered are listed, but only abnormal results are displayed) Labs Reviewed  BASIC METABOLIC PANEL - Abnormal; Notable  for the following components:      Result Value   Sodium 134 (*)    Potassium 3.4 (*)    CO2 21 (*)    All other components within normal limits  CBC WITH DIFFERENTIAL/PLATELET - Abnormal; Notable for the following components:   Hemoglobin 8.9 (*)    HCT 28.2 (*)    MCV 70.7 (*)    MCH 22.3 (*)    All other components within normal limits    EKG EKG Interpretation  Date/Time:  Friday February 14 2021 01:05:10  EST Ventricular Rate:  73 PR Interval:  166 QRS Duration: 65 QT Interval:  347 QTC Calculation: 383 R Axis:   70 Text Interpretation: Sinus rhythm No significant change since last tracing Confirmed by Ripley Fraise [phone]) on 02/14/2021 1:33:49 AM  Radiology No results found.  Procedures Procedures    Medications Ordered in ED Medications  sodium chloride 0.9 % bolus 1,000 mL (0 mLs Intravenous Stopped 02/14/21 0418)  levETIRAcetam (KEPPRA) IVPB 1000 mg/100 mL premix (0 mg Intravenous Stopped 02/14/21 0208)  sodium chloride 0.9 % bolus 1,000 mL (0 mLs Intravenous Stopped 02/14/21 0525)    ED Course/ Medical Decision Making/ A&P Clinical Course as of 02/14/21 0544  Fri Feb 14, 2021  0143 Patient with known history of seizures presents with seizure episode.  Per records, patient is around 12 weeks for single IUP.  She reports this is her first pregnancy.  She reports due to feeling nausea frequently, she does not always take her Keppra.  She will be given a loading dose of Keppra here in the emergency department. [DW]  7619 She has mild tenderness noted to her posterior scalp, but no signs of any acute traumatic injury.  She has an appropriate mental status, will defer CT head for now [DW]  0146 Prehospital EKG was reviewed [DW]  0543 Patient improved, awake and alert, ambulatory, taking p.o. She reports now that due to cost she has been unable to afford the Keppra.  She claims it is $100.  However it appears that she can get it under $10 at a local [place].  She has no local neurology care, urgent referral has been placed as an outpatient.  Instructed patient on no driving, no bathing or swimming alone.  Patient reports she does not drive [DW]    Clinical Course User Index [DW] Ripley Fraise, MD                           Medical Decision Making  This patient presents to the ED for concern of seizure, this involves an extensive number of treatment options, and is a complaint  that carries with it a high risk of complications and morbidity.   Comorbidities that complicate the patient evaluation: Patients presentation is complicated by their history of pregnancy   Additional history obtained: Records reviewed previous admission documents  Lab Tests: I Ordered, and personally interpreted labs.  The pertinent results include: Mild anemia  Imaging Studies ordered: No indication for imaging  Cardiac Monitoring: The patient was maintained on a cardiac monitor.  I personally viewed and interpreted the cardiac monitor which showed an underlying rhythm of:  sinus rhythm  Medicines ordered and prescription drug management: I ordered medication including Keppra IV for seizure Reevaluation of the patient after these medicines showed that the patient    improved  Critical Interventions:       Keppra IV was given  Reevaluation: After the  interventions noted above, I reevaluated the patient and found that they have :improved  Complexity of problems addressed: Patients presentation is most consistent with  exacerbation of chronic illness      Disposition: After consideration of the diagnostic results and the patients response to treatment,  I feel that the patent would benefit from discharge   Patient improved.  She is in no acute distress.  Denies any abdominal pain or vaginal bleeding.  Likely has acute on chronic anemia and denies any recent blood loss.  She be given urgent referral to local neurologist.  Prescription for Keppra has been given at discharge.          Final Clinical Impression(s) / ED Diagnoses Final diagnoses:  Seizure ([place])    Rx / DC Orders ED Discharge Orders          Ordered    Ambulatory referral to Neurology       Comments: An appointment is requested in approximately: 1 week   02/14/21 0541    levETIRAcetam (KEPPRA) 500 MG tablet  2 times daily        02/14/21 0544              Ripley Fraise, MD 02/14/21  [phone]

## 2021-02-14 NOTE — ED Notes (Signed)
Pt ambulated with no difficulties. Pulse ox sats maintained in upper 90s.

## 2021-02-14 NOTE — ED Notes (Signed)
Pt given sandwich and fluids at bedside.

## 2021-02-14 NOTE — ED Notes (Signed)
Patient verbalizes understanding of d/c instructions. Opportunities for questions and answers were provided. Pt d/c from ED and ambulated to lobby where she will be waiting for brother to pick her up.

## 2021-02-14 NOTE — Discharge Instructions (Addendum)
Please take your medication to [facility] as it should be under $10 for your prescription  Please be aware you may have another seizure  Do not drive until seen by your physician for your condition  Do not climb ladders/roofs/trees as a seizure can occur at that height and cause serious harm  Do not bathe/swim alone as a seizure can occur and cause serious harm  Please followup with your physician or neurologist for further testing and possible treatment

## 2021-02-16 ENCOUNTER — Inpatient Hospital Stay (HOSPITAL_COMMUNITY)

## 2021-02-16 ENCOUNTER — Encounter (HOSPITAL_COMMUNITY): Payer: Self-pay | Admitting: Obstetrics & Gynecology

## 2021-02-16 ENCOUNTER — Other Ambulatory Visit: Payer: Self-pay

## 2021-02-16 ENCOUNTER — Inpatient Hospital Stay (HOSPITAL_COMMUNITY)
Admission: AD | Admit: 2021-02-16 | Discharge: 2021-02-16 | Disposition: A | Discharge status: Home / Self Care | Attending: Obstetrics & Gynecology | Admitting: Obstetrics & Gynecology

## 2021-02-16 DIAGNOSIS — Z79899 Other long term (current) drug therapy: Secondary | ICD-10-CM | POA: Diagnosis not present

## 2021-02-16 DIAGNOSIS — D509 Iron deficiency anemia, unspecified: Secondary | ICD-10-CM | POA: Diagnosis not present

## 2021-02-16 DIAGNOSIS — O3481 Maternal care for other abnormalities of pelvic organs, first trimester: Secondary | ICD-10-CM | POA: Diagnosis not present

## 2021-02-16 DIAGNOSIS — Z3A12 12 weeks gestation of pregnancy: Secondary | ICD-10-CM | POA: Diagnosis not present

## 2021-02-16 DIAGNOSIS — R1032 Left lower quadrant pain: Secondary | ICD-10-CM | POA: Diagnosis not present

## 2021-02-16 DIAGNOSIS — R52 Pain, unspecified: Secondary | ICD-10-CM

## 2021-02-16 DIAGNOSIS — O99011 Anemia complicating pregnancy, first trimester: Secondary | ICD-10-CM

## 2021-02-16 DIAGNOSIS — N83202 Unspecified ovarian cyst, left side: Secondary | ICD-10-CM | POA: Diagnosis not present

## 2021-02-16 DIAGNOSIS — R569 Unspecified convulsions: Secondary | ICD-10-CM | POA: Diagnosis not present

## 2021-02-16 DIAGNOSIS — O26891 Other specified pregnancy related conditions, first trimester: Secondary | ICD-10-CM | POA: Diagnosis not present

## 2021-02-16 DIAGNOSIS — O99351 Diseases of the nervous system complicating pregnancy, first trimester: Secondary | ICD-10-CM | POA: Diagnosis not present

## 2021-02-16 LAB — URINALYSIS, ROUTINE W REFLEX MICROSCOPIC
Bilirubin Urine: NEGATIVE
Glucose, UA: NEGATIVE mg/dL
Hgb urine dipstick: NEGATIVE
Ketones, ur: NEGATIVE mg/dL
Nitrite: NEGATIVE
Protein, ur: NEGATIVE mg/dL
Specific Gravity, Urine: >1.03 — ABNORMAL HIGH (ref 1.005–1.030)
pH: 6.5 (ref 5.0–8.0)

## 2021-02-16 LAB — URINALYSIS, MICROSCOPIC (REFLEX)

## 2021-02-16 MED ORDER — POLYSACCHARIDE IRON COMPLEX 150 MG PO CAPS
150.0000 mg | ORAL_CAPSULE | ORAL | 0 refills | Status: AC
Start: 2021-02-16 — End: 2021-03-18

## 2021-02-16 MED ORDER — ACETAMINOPHEN 500 MG PO TABS
1000.0000 mg | ORAL_TABLET | Freq: Once | ORAL | Status: AC
  Administered 2021-02-16: 1000 mg via ORAL
  Filled 2021-02-16: qty 2

## 2021-02-16 MED ORDER — SODIUM CHLORIDE 0.9 % IV SOLN: Freq: Once | INTRAVENOUS | Status: AC

## 2021-02-16 MED ORDER — SODIUM CHLORIDE 0.9 % IV SOLN
510.0000 mg | Freq: Once | INTRAVENOUS | Status: AC
  Administered 2021-02-16: 510 mg via INTRAVENOUS
  Filled 2021-02-16: qty 17

## 2021-02-16 MED ORDER — DOCUSATE SODIUM 100 MG PO CAPS: 100.0000 mg | ORAL_CAPSULE | Freq: Two times a day (BID) | ORAL | 2 refills | Status: DC | PRN

## 2021-02-16 NOTE — MAU Note (Signed)
Told her she had a cyst on left side.  Having pain on left side, getting worse.  Was told to come back if that happened. Feeling pressure on her bladder. No pain with urination, but has  pain on her butt.  Not sure if that is from being constipated. (Last bm 1/15- had been 4 or 5days)

## 2021-02-16 NOTE — MAU Provider Note (Signed)
History     CSN: [id number]  Arrival date and time: 02/16/21 1707   Event Date/Time   First Provider Initiated Contact with Patient 02/16/21 1757      Chief Complaint  Patient presents with   Abdominal Pain   HPI Donna Guzman is a 21 y.o. G2P0010 at [redacted]w[redacted]d who presents to MAU with chief complaint of LLQ pain in the setting of previously diagnosed left ovarian cyst. Patient's pain seemed to worsen significantly this morning. Pain score on arrival to MAU is 7/10. Pain does not radiate. She denies aggravating or alleviating factors. She has not taken medication or tried other treatments for this complaint.   Patient's medical history is significant for seizures, managed with Keppra. Patient endorses being compliant with medications as prescribed but is not sure about how to obtain the [facility] price she was told about during her [facility] encounter two days ago.   Patient's medical history also includes iron deficiency anemia. Patient states she is aware of this diagnosis but is not taking iron aside from the iron in her prenatal vitamin.  Patient has established care with [facility].  OB History     Gravida  2   Para  0   Term  0   Preterm  0   AB  1   Living  0      SAB  0   IAB  1   Ectopic  0   Multiple  0   Live Births  0           Past Medical History:  Diagnosis Date   Seizures (HCC)     Past Surgical History:  Procedure Laterality Date   LACERATION REPAIR     forehead    Family History  Problem Relation Age of Onset   Diabetes Mother    Hypertension Mother    Hypertension Father    ADD / ADHD Sister     Social History   Tobacco Use   Smoking status: Former   Smokeless tobacco: Former  Building services engineer Use: Former  Substance Use Topics   Alcohol use: Not Currently   Drug use: Not Currently    Types: Marijuana    Comment: last use was at beginning of pregancy    Allergies: No Known Allergies  Medications Prior to Admission  Medication  Sig Dispense Refill Last Dose   levETIRAcetam (KEPPRA) 500 MG tablet Take 1 tablet (500 mg total) by mouth 2 (two) times daily. 60 tablet 0 02/16/2021 at 1000   Prenatal MV & Min w/FA-DHA (PRENATAL GUMMIES PO) Take 1 tablet by mouth daily.   02/16/2021 at 1000   Prenatal Vit-Fe Fumarate-FA (PREPLUS) 27-1 MG TABS Take 1 tablet by mouth daily. (Patient not taking: Reported on 02/14/2021) 30 tablet 13     Review of Systems  Constitutional:  Positive for fatigue.  Gastrointestinal:  Positive for abdominal pain.  All other systems reviewed and are negative. Physical Exam   Blood pressure (!) 89/66, pulse 76, temperature 98.3 F (36.8 C), temperature source Oral, resp. rate 20, height 5\' 2"  (1.575 m), weight 66 kg, last menstrual period 11/18/2020, SpO2 100 %.  Physical Exam Vitals and nursing note reviewed. Exam conducted with a chaperone present.  Constitutional:      Appearance: She is well-developed. She is not ill-appearing.  Cardiovascular:     Rate and Rhythm: Normal rate and regular rhythm.     Heart sounds: Normal heart sounds.  Pulmonary:  Effort: Pulmonary effort is normal.     Breath sounds: Normal breath sounds.  Abdominal:     General: Abdomen is flat.     Palpations: Abdomen is soft.     Tenderness: There is no abdominal tenderness. There is no right CVA tenderness or left CVA tenderness.  Skin:    Capillary Refill: Capillary refill takes less than 2 seconds.  Neurological:     Mental Status: She is alert and oriented to person, place, and time.  Psychiatric:        Mood and Affect: Mood normal.        Behavior: Behavior normal.    MAU Course  Procedures  --Patient denies recent seizure. EMR reveals [facility] visit two days ago in which patient communicated she could not afford $100 out of pocket cost for Keppra. CNM supports Dr. Nelda MarseilleWickline's [facility] website confirms medication is  available at this price without coupon. Information printed for patient  Orders Placed This Encounter  Procedures   US OB Comp Less 14 Wks   Urinalysis, Routine w reflex microscopic   Urinalysis, Microscopic (reflex)   Insert peripheral IV   Discharge patient   Patient Vitals for the past 24 hrs:  BP Temp Temp src Pulse Resp SpO2 Height Weight  02/16/21 1850 -- -- -- -- -- 100 % -- --  02/16/21 1848 (!) 89/66 -- -- 76 20 100 % -- --  02/16/21 1755 116/70 -- -- 82 18 100 % -- --  02/16/21 1724 119/68 98.3 F (36.8 C) Oral 85 16 99 % 5\' 2"  (1.575 m) 66 kg   Results for orders placed or performed during the hospital encounter of 02/16/21 (from the past 24 hour(s))  Urinalysis, Routine w reflex microscopic Urine, Clean Catch     Status: Abnormal   Collection Time: 02/16/21  5:37 PM  Result Value Ref Range   Color, Urine YELLOW YELLOW   APPearance HAZY (A) CLEAR   Specific Gravity, Urine >1.030 (H) 1.005 - 1.030   pH 6.5 5.0 - 8.0   Glucose, UA NEGATIVE NEGATIVE mg/dL   Hgb urine dipstick NEGATIVE NEGATIVE   Bilirubin Urine NEGATIVE NEGATIVE   Ketones, ur NEGATIVE NEGATIVE mg/dL   Protein, ur NEGATIVE NEGATIVE mg/dL   Nitrite NEGATIVE NEGATIVE   Leukocytes,Ua SMALL (A) NEGATIVE  Urinalysis, Microscopic (reflex)     Status: Abnormal   Collection Time: 02/16/21  5:37 PM  Result Value Ref Range   RBC / HPF 11-20 0 - 5 RBC/hpf   WBC, UA 21-50 0 - 5 WBC/hpf   Bacteria, UA RARE (A) NONE SEEN   Squamous Epithelial / LPF 11-20 0 - 5   Mucus PRESENT    US OB Comp Less 14 Wks  Result Date: 02/16/2021 CLINICAL DATA:  Left-sided pelvic pain. History of left ovarian cyst EXAM: OBSTETRIC <14 WK ULTRASOUND TECHNIQUE: Transabdominal ultrasound was performed for evaluation of the gestation as well as the maternal uterus and adnexal regions. COMPARISON:  02/01/2021 FINDINGS: Intrauterine gestational sac: Single Yolk sac:  Not Visualized. Embryo:  Visualized. Cardiac Activity: Visualized. Heart  Rate: 171 bpm CRL:   62.1 mm   12 w 4 d                  US EDC: 08/27/2021 Subchorionic hemorrhage:  None visualized. Maternal uterus/adnexae: Redemonstration of a complex left ovarian cyst with low level internal echoes measuring approximately  2.3 cm. Adjacent simple 2.7 cm left ovarian cyst or follicle. Right ovary within normal limits. No free fluid within the pelvis. IMPRESSION: 1. Single live intrauterine gestation measuring 12 weeks 4 days by crown-rump length. 2. Active fetal heart tones at 171 BPM. 3. Slight interval decrease in size of known complex left ovarian cyst measuring 2.7 cm (previously measured 3.0 cm). Electronically Signed   By: Duanne Guess D.O.   On: 02/16/2021 18:59    Meds ordered this encounter  Medications   acetaminophen (TYLENOL) tablet 1,000 mg   0.9 %  sodium chloride infusion   ferumoxytol (FERAHEME) 510 mg in sodium chloride 0.9 % 100 mL IVPB    MAU patient, will discharge home once infusion is complete   iron polysaccharides (NIFEREX) 150 MG capsule    Sig: Take 1 capsule (150 mg total) by mouth every other day.    Dispense:  15 capsule    Refill:  0    Order Specific Question:   Supervising Provider    Answer:   Jaynie Collins A [[id number]]   docusate sodium (COLACE) 100 MG capsule    Sig: Take 1 capsule (100 mg total) by mouth 2 (two) times daily as needed.    Dispense:  30 capsule    Refill:  2    Order Specific Question:   Supervising Provider    Answer:   Jaynie Collins A [[id number]]   Assessment and Plan  --21 y.o. G2P0010 at [redacted]w[redacted]d  --FHT 171 --Left ovarian cyst slightly smaller than previous --Asymptomatic Anemia, s/p Feraheme infusion in MAU --Patient given GoodRx coupon and [facility] medication list confirming Keppra is available there for $9 for 30 day supply.  --Discharge home in stable condition  Calvert Cantor, [place] 02/16/2021, 8:35 PM

## 2021-02-19 ENCOUNTER — Encounter: Payer: Self-pay | Admitting: Obstetrics and Gynecology

## 2021-02-22 ENCOUNTER — Inpatient Hospital Stay (HOSPITAL_COMMUNITY)
Admission: AD | Admit: 2021-02-22 | Discharge: 2021-02-22 | Disposition: A | Discharge status: Home / Self Care | Attending: Family Medicine | Admitting: Family Medicine

## 2021-02-22 ENCOUNTER — Other Ambulatory Visit: Payer: Self-pay

## 2021-02-22 ENCOUNTER — Encounter (HOSPITAL_COMMUNITY): Payer: Self-pay | Admitting: Family Medicine

## 2021-02-22 DIAGNOSIS — Z3A13 13 weeks gestation of pregnancy: Secondary | ICD-10-CM | POA: Diagnosis not present

## 2021-02-22 DIAGNOSIS — O26891 Other specified pregnancy related conditions, first trimester: Secondary | ICD-10-CM | POA: Diagnosis present

## 2021-02-22 DIAGNOSIS — S3991XA Unspecified injury of abdomen, initial encounter: Secondary | ICD-10-CM | POA: Insufficient documentation

## 2021-02-22 DIAGNOSIS — R109 Unspecified abdominal pain: Secondary | ICD-10-CM | POA: Diagnosis not present

## 2021-02-22 DIAGNOSIS — O9A211 Injury, poisoning and certain other consequences of external causes complicating pregnancy, first trimester: Secondary | ICD-10-CM | POA: Insufficient documentation

## 2021-02-22 DIAGNOSIS — O26899 Other specified pregnancy related conditions, unspecified trimester: Secondary | ICD-10-CM

## 2021-02-22 LAB — URINALYSIS, ROUTINE W REFLEX MICROSCOPIC
Bilirubin Urine: NEGATIVE
Glucose, UA: NEGATIVE mg/dL
Hgb urine dipstick: NEGATIVE
Ketones, ur: NEGATIVE mg/dL
Nitrite: NEGATIVE
Protein, ur: NEGATIVE mg/dL
Specific Gravity, Urine: 1.024 (ref 1.005–1.030)
pH: 6 (ref 5.0–8.0)

## 2021-02-22 MED ORDER — CYCLOBENZAPRINE HCL 5 MG PO TABS
10.0000 mg | ORAL_TABLET | Freq: Once | ORAL | Status: AC
  Administered 2021-02-22: 10 mg via ORAL
  Filled 2021-02-22: qty 2

## 2021-02-22 NOTE — Discharge Instructions (Signed)
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
## 2021-02-24 ENCOUNTER — Encounter: Payer: Self-pay | Admitting: *Deleted

## 2021-02-24 DIAGNOSIS — Z3490 Encounter for supervision of normal pregnancy, unspecified, unspecified trimester: Secondary | ICD-10-CM

## 2021-02-28 ENCOUNTER — Telehealth: Payer: Self-pay

## 2021-02-28 ENCOUNTER — Encounter: Payer: Self-pay | Admitting: Obstetrics and Gynecology

## 2021-02-28 DIAGNOSIS — D563 Thalassemia minor: Secondary | ICD-10-CM | POA: Insufficient documentation

## 2021-02-28 NOTE — Telephone Encounter (Signed)
Called Pt to go over [employer], no answer. Left VM.

## 2021-03-01 ENCOUNTER — Inpatient Hospital Stay (HOSPITAL_COMMUNITY)
Admission: AD | Admit: 2021-03-01 | Discharge: 2021-03-01 | Disposition: A | Discharge status: Home / Self Care | Attending: Obstetrics & Gynecology | Admitting: Obstetrics & Gynecology

## 2021-03-01 ENCOUNTER — Encounter (HOSPITAL_COMMUNITY): Payer: Self-pay | Admitting: Obstetrics & Gynecology

## 2021-03-01 DIAGNOSIS — M5432 Sciatica, left side: Secondary | ICD-10-CM | POA: Diagnosis not present

## 2021-03-01 DIAGNOSIS — N83209 Unspecified ovarian cyst, unspecified side: Secondary | ICD-10-CM | POA: Diagnosis not present

## 2021-03-01 DIAGNOSIS — O3482 Maternal care for other abnormalities of pelvic organs, second trimester: Secondary | ICD-10-CM | POA: Insufficient documentation

## 2021-03-01 DIAGNOSIS — Z3A14 14 weeks gestation of pregnancy: Secondary | ICD-10-CM | POA: Diagnosis not present

## 2021-03-01 DIAGNOSIS — O26892 Other specified pregnancy related conditions, second trimester: Secondary | ICD-10-CM | POA: Diagnosis present

## 2021-03-01 DIAGNOSIS — Z87891 Personal history of nicotine dependence: Secondary | ICD-10-CM | POA: Insufficient documentation

## 2021-03-01 DIAGNOSIS — R102 Pelvic and perineal pain: Secondary | ICD-10-CM | POA: Diagnosis not present

## 2021-03-01 LAB — URINALYSIS, ROUTINE W REFLEX MICROSCOPIC
Bilirubin Urine: NEGATIVE
Glucose, UA: NEGATIVE mg/dL
Hgb urine dipstick: NEGATIVE
Ketones, ur: NEGATIVE mg/dL
Leukocytes,Ua: NEGATIVE
Nitrite: NEGATIVE
Protein, ur: NEGATIVE mg/dL
Specific Gravity, Urine: 1.023 (ref 1.005–1.030)
pH: 6 (ref 5.0–8.0)

## 2021-03-01 MED ORDER — ONDANSETRON HCL 8 MG PO TABS: 8.0000 mg | ORAL_TABLET | Freq: Three times a day (TID) | ORAL | 0 refills | Status: DC | PRN

## 2021-03-01 MED ORDER — CYCLOBENZAPRINE HCL 10 MG PO TABS: 10.0000 mg | ORAL_TABLET | Freq: Two times a day (BID) | ORAL | 0 refills | Status: AC | PRN

## 2021-03-01 MED ORDER — PROMETHAZINE HCL 25 MG PO TABS: 25.0000 mg | ORAL_TABLET | Freq: Four times a day (QID) | ORAL | 0 refills | Status: DC | PRN

## 2021-03-01 MED ORDER — CYCLOBENZAPRINE HCL 5 MG PO TABS
10.0000 mg | ORAL_TABLET | Freq: Once | ORAL | Status: AC
Start: 2021-03-01 — End: 2021-03-01
  Administered 2021-03-01: 10 mg via ORAL
  Filled 2021-03-01: qty 2

## 2021-03-01 NOTE — MAU Provider Note (Signed)
History     CSN: [id number]  Arrival date and time: 03/01/21 1258   Event Date/Time   First Provider Initiated Contact with Patient 03/01/21 1350      Chief Complaint  Patient presents with   Back Pain   Pelvic Pain   HPI Donna Guzman is a 21 y.o. G2P0010 at [redacted]w[redacted]d who presents with back pain. This is an ongoing issue for her and her 7th visit in MAU this pregnancy with the complaint of back pain. She states the pain comes and goes. It is sharp and shooting in nature and sometimes she can feel it in her vagina. She has tried one tylenol but states tylenol doesn't work for her. She denies any abdominal pain, vaginal bleeding or discharge. Patient is concerned the pain is related to her ovarian cyst.   OB History     Gravida  2   Para  0   Term  0   Preterm  0   AB  1   Living  0      SAB  0   IAB  1   Ectopic  0   Multiple  0   Live Births  0           Past Medical History:  Diagnosis Date   Seizures (HCC)     Past Surgical History:  Procedure Laterality Date   LACERATION REPAIR     forehead    Family History  Problem Relation Age of Onset   Diabetes Mother    Hypertension Mother    Hypertension Father    ADD / ADHD Sister     Social History   Tobacco Use   Smoking status: Former   Smokeless tobacco: Former  Building services engineer Use: Former  Substance Use Topics   Alcohol use: Not Currently   Drug use: Not Currently    Types: Marijuana    Comment: last use was at beginning of pregancy    Allergies: No Known Allergies  Medications Prior to Admission  Medication Sig Dispense Refill Last Dose   docusate sodium (COLACE) 100 MG capsule Take 1 capsule (100 mg total) by mouth 2 (two) times daily as needed. 30 capsule 2    iron polysaccharides (NIFEREX) 150 MG capsule Take 1 capsule (150 mg total) by mouth every other day. 15 capsule 0    levETIRAcetam (KEPPRA) 500 MG tablet Take 1 tablet (500 mg total) by mouth 2 (two) times daily. 60  tablet 0    Prenatal MV & Min w/FA-DHA (PRENATAL GUMMIES PO) Take 1 tablet by mouth daily.      Prenatal Vit-Fe Fumarate-FA (PREPLUS) 27-1 MG TABS Take 1 tablet by mouth daily. (Patient not taking: Reported on 02/14/2021) 30 tablet 13     Review of Systems  Constitutional: Negative.  Negative for fatigue and fever.  HENT: Negative.    Respiratory: Negative.  Negative for shortness of breath.   Cardiovascular: Negative.  Negative for chest pain.  Gastrointestinal: Negative.  Negative for abdominal pain, constipation, diarrhea, nausea and vomiting.  Genitourinary: Negative.  Negative for dysuria, vaginal bleeding and vaginal discharge.  Musculoskeletal:  Positive for back pain.  Neurological: Negative.  Negative for dizziness and headaches.  Physical Exam   Blood pressure 110/69, pulse 87, temperature 98.5 F (36.9 C), temperature source Oral, resp. rate 15, last menstrual period 11/18/2020, SpO2 100 %.  Physical Exam Vitals and nursing note reviewed.  Constitutional:      General: She is not in  acute distress.    Appearance: She is well-developed.  HENT:     Head: Normocephalic.  Eyes:     Pupils: Pupils are equal, round, and reactive to light.  Cardiovascular:     Rate and Rhythm: Normal rate and regular rhythm.     Heart sounds: Normal heart sounds.  Pulmonary:     Effort: Pulmonary effort is normal. No respiratory distress.     Breath sounds: Normal breath sounds.  Abdominal:     General: Bowel sounds are normal. There is no distension.     Palpations: Abdomen is soft.     Tenderness: There is no abdominal tenderness.  Skin:    General: Skin is warm and dry.  Neurological:     Mental Status: She is alert and oriented to person, place, and time.  Psychiatric:        Mood and Affect: Mood normal.        Behavior: Behavior normal.        Thought Content: Thought content normal.        Judgment: Judgment normal.   FHT: 155bpm  MAU Course  Procedures Results for orders  placed or performed during the hospital encounter of 03/01/21 (from the past 24 hour(s))  Urinalysis, Routine w reflex microscopic Urine, Clean Catch     Status: Abnormal   Collection Time: 03/01/21  1:51 PM  Result Value Ref Range   Color, Urine YELLOW YELLOW   APPearance HAZY (A) CLEAR   Specific Gravity, Urine 1.023 1.005 - 1.030   pH 6.0 5.0 - 8.0   Glucose, UA NEGATIVE NEGATIVE mg/dL   Hgb urine dipstick NEGATIVE NEGATIVE   Bilirubin Urine NEGATIVE NEGATIVE   Ketones, ur NEGATIVE NEGATIVE mg/dL   Protein, ur NEGATIVE NEGATIVE mg/dL   Nitrite NEGATIVE NEGATIVE   Leukocytes,Ua NEGATIVE NEGATIVE    MDM UA Flexeril PO Reassurance that this is likely not related to her 2cm ovarian cyst. This does not need reimaging today and can be looked at with her anatomy ultrasound. Discussed changes in her body related to growing uterus and likely pain is related to uterine position. Discussed that pregnancy is not pain free and she may have periods where the growing of her uterus causes pain. Declines cervical exam at this time.   Assessment and Plan   1. Sciatica of left side   2. [redacted] weeks gestation of pregnancy    -Discharge home in stable condition -Rx for flexeril and zofran given to patient -Second trimester precautions discussed -Patient advised to follow-up with OB as scheduled for prenatal care -Patient may return to MAU as needed or if her condition were to change or worsen   Rolm Bookbinder CNM 03/01/2021, 1:50 PM

## 2021-03-01 NOTE — Discharge Instructions (Signed)

## 2021-03-01 NOTE — MAU Note (Signed)
Pt reports to mau with c/o lower back pain that is sharp and radiates down to her pelvis.  Denies bleeding today but states she noticed spotting a few days ago.

## 2021-03-04 ENCOUNTER — Ambulatory Visit: Admitting: Neurology

## 2021-03-04 ENCOUNTER — Encounter: Payer: Self-pay | Admitting: Neurology

## 2021-03-06 ENCOUNTER — Encounter: Payer: Self-pay | Admitting: *Deleted

## 2021-03-08 ENCOUNTER — Encounter (HOSPITAL_COMMUNITY): Payer: Self-pay | Admitting: Obstetrics and Gynecology

## 2021-03-08 ENCOUNTER — Inpatient Hospital Stay (HOSPITAL_COMMUNITY)
Admission: AD | Admit: 2021-03-08 | Discharge: 2021-03-08 | Disposition: A | Discharge status: Home / Self Care | Attending: Obstetrics and Gynecology | Admitting: Obstetrics and Gynecology

## 2021-03-08 ENCOUNTER — Other Ambulatory Visit: Payer: Self-pay

## 2021-03-08 DIAGNOSIS — O26852 Spotting complicating pregnancy, second trimester: Secondary | ICD-10-CM | POA: Diagnosis present

## 2021-03-08 DIAGNOSIS — Z711 Person with feared health complaint in whom no diagnosis is made: Secondary | ICD-10-CM | POA: Diagnosis not present

## 2021-03-08 DIAGNOSIS — B9689 Other specified bacterial agents as the cause of diseases classified elsewhere: Secondary | ICD-10-CM

## 2021-03-08 DIAGNOSIS — O23592 Infection of other part of genital tract in pregnancy, second trimester: Secondary | ICD-10-CM | POA: Insufficient documentation

## 2021-03-08 DIAGNOSIS — Z3A15 15 weeks gestation of pregnancy: Secondary | ICD-10-CM | POA: Diagnosis not present

## 2021-03-08 DIAGNOSIS — N760 Acute vaginitis: Secondary | ICD-10-CM

## 2021-03-08 LAB — URINALYSIS, ROUTINE W REFLEX MICROSCOPIC
Bilirubin Urine: NEGATIVE
Glucose, UA: NEGATIVE mg/dL
Hgb urine dipstick: NEGATIVE
Ketones, ur: NEGATIVE mg/dL
Leukocytes,Ua: NEGATIVE
Nitrite: NEGATIVE
Protein, ur: NEGATIVE mg/dL
Specific Gravity, Urine: 1.025 (ref 1.005–1.030)
pH: 7 (ref 5.0–8.0)

## 2021-03-08 LAB — WET PREP, GENITAL
Sperm: NONE SEEN
Trich, Wet Prep: NONE SEEN
WBC, Wet Prep HPF POC: <10 (ref <10)
Yeast Wet Prep HPF POC: NONE SEEN

## 2021-03-08 MED ORDER — METRONIDAZOLE 500 MG PO TABS: 500.0000 mg | ORAL_TABLET | Freq: Two times a day (BID) | ORAL | 0 refills | Status: DC

## 2021-03-08 NOTE — MAU Note (Signed)
Patient arrived to MAU from home complaining of vaginal bleeding that is like a period, that started yesterday morning at 9am. Patient stated that she sees pink discharge after wiping. Patient denies any recent intercourse.

## 2021-03-08 NOTE — MAU Provider Note (Signed)
History     CSN: [id number]  Arrival date and time: 03/08/21 1449   Event Date/Time   First Provider Initiated Contact with Patient 03/08/21 1516     Chief Complaint  Patient presents with   Vaginal Bleeding   HPI Donna Guzman is a 21 y.o. G2P0010 at [redacted]w[redacted]d who presents with vaginal bleeding. She states yesterday she had bleeding like a period. Today she only sees spotting when she wipes. She denies any pain or abnormal discharge.   OB History     Gravida  2   Para  0   Term  0   Preterm  0   AB  1   Living  0      SAB  0   IAB  1   Ectopic  0   Multiple  0   Live Births  0           Past Medical History:  Diagnosis Date   Seizures (HCC)     Past Surgical History:  Procedure Laterality Date   LACERATION REPAIR     forehead    Family History  Problem Relation Age of Onset   Diabetes Mother    Hypertension Mother    Hypertension Father    ADD / ADHD Sister     Social History   Tobacco Use   Smoking status: Former   Smokeless tobacco: Former  Building services engineer Use: Former  Substance Use Topics   Alcohol use: Not Currently   Drug use: Not Currently    Types: Marijuana    Comment: last use was at beginning of pregancy    Allergies: No Known Allergies  Medications Prior to Admission  Medication Sig Dispense Refill Last Dose   cyclobenzaprine (FLEXERIL) 10 MG tablet Take 1 tablet (10 mg total) by mouth 2 (two) times daily as needed for muscle spasms. 20 tablet 0    docusate sodium (COLACE) 100 MG capsule Take 1 capsule (100 mg total) by mouth 2 (two) times daily as needed. 30 capsule 2    iron polysaccharides (NIFEREX) 150 MG capsule Take 1 capsule (150 mg total) by mouth every other day. 15 capsule 0    levETIRAcetam (KEPPRA) 500 MG tablet Take 1 tablet (500 mg total) by mouth 2 (two) times daily. 60 tablet 0    ondansetron (ZOFRAN) 8 MG tablet Take 1 tablet (8 mg total) by mouth every 8 (eight) hours as needed for nausea or vomiting.  30 tablet 0    Prenatal MV & Min w/FA-DHA (PRENATAL GUMMIES PO) Take 1 tablet by mouth daily.      Prenatal Vit-Fe Fumarate-FA (PREPLUS) 27-1 MG TABS Take 1 tablet by mouth daily. (Patient not taking: Reported on 02/14/2021) 30 tablet 13    promethazine (PHENERGAN) 25 MG tablet Take 1 tablet (25 mg total) by mouth every 6 (six) hours as needed for nausea or vomiting. 30 tablet 0     Review of Systems  Constitutional: Negative.  Negative for fatigue and fever.  HENT: Negative.    Respiratory: Negative.  Negative for shortness of breath.   Cardiovascular: Negative.  Negative for chest pain.  Gastrointestinal: Negative.  Negative for abdominal pain, constipation, diarrhea, nausea and vomiting.  Genitourinary:  Positive for vaginal bleeding. Negative for dysuria and vaginal discharge.  Neurological: Negative.  Negative for dizziness and headaches.  Physical Exam   Blood pressure 133/68, pulse 87, resp. rate 17, last menstrual period 11/18/2020, SpO2 100 %.  Physical Exam Vitals and  nursing note reviewed.  Constitutional:      General: She is not in acute distress.    Appearance: She is well-developed.  HENT:     Head: Normocephalic.  Eyes:     Pupils: Pupils are equal, round, and reactive to light.  Cardiovascular:     Rate and Rhythm: Normal rate and regular rhythm.     Heart sounds: Normal heart sounds.  Pulmonary:     Effort: Pulmonary effort is normal. No respiratory distress.     Breath sounds: Normal breath sounds.  Abdominal:     General: Bowel sounds are normal. There is no distension.     Palpations: Abdomen is soft.     Tenderness: There is no abdominal tenderness.  Genitourinary:    Comments: Pelvic exam: Cervix pink, visually closed, without lesion, scant white creamy discharge, vaginal walls and external genitalia normal Bimanual exam: Cervix 0/long/high, firm, anterior, neg CMT, uterus nontender, adnexa without tenderness, enlargement, or mass   Skin:    General:  Skin is warm and dry.  Neurological:     Mental Status: She is alert and oriented to person, place, and time.  Psychiatric:        Mood and Affect: Mood normal.        Behavior: Behavior normal.        Thought Content: Thought content normal.        Judgment: Judgment normal.   FHT: 159 bpm  MAU Course  Procedures Results for orders placed or performed during the hospital encounter of 03/08/21 (from the past 24 hour(s))  Urinalysis, Routine w reflex microscopic Urine, Clean Catch     Status: None   Collection Time: 03/08/21  3:26 PM  Result Value Ref Range   Color, Urine YELLOW YELLOW   APPearance CLEAR CLEAR   Specific Gravity, Urine 1.025 1.005 - 1.030   pH 7.0 5.0 - 8.0   Glucose, UA NEGATIVE NEGATIVE mg/dL   Hgb urine dipstick NEGATIVE NEGATIVE   Bilirubin Urine NEGATIVE NEGATIVE   Ketones, ur NEGATIVE NEGATIVE mg/dL   Protein, ur NEGATIVE NEGATIVE mg/dL   Nitrite NEGATIVE NEGATIVE   Leukocytes,Ua NEGATIVE NEGATIVE  Wet prep, genital     Status: Abnormal   Collection Time: 03/08/21  3:27 PM   Specimen: PATH Cytology Cervicovaginal Ancillary Only  Result Value Ref Range   Yeast Wet Prep HPF POC NONE SEEN NONE SEEN   Trich, Wet Prep NONE SEEN NONE SEEN   Clue Cells Wet Prep HPF POC PRESENT (A) NONE SEEN   WBC, Wet Prep HPF POC <10 <10   Sperm NONE SEEN     MDM UA SSE: no bleeding, cervix closed  After exam and reassurance, patient states she is only here because she wants to discuss her results of her genetic screening showing her as a carrier of alpha thalassemia. Results reviewed at length and possible implications for infant. Discussed partner testing and patient states this is not an option due to no involvement by FOB.   After exam, patient states her ride is leaving and she has to leave. Discussed that results are not back yet and patient states she is ok with that and has to leave now.   Assessment and Plan   1. Physically well but worried   2. [redacted] weeks  gestation of pregnancy   3. Bacterial vaginosis    -Results received after patient left. RX sent for metronidazole and MyChart message sent to patient with results.   Rolm Bookbinder CNM  03/08/2021, 3:16 PM

## 2021-03-08 NOTE — Discharge Instructions (Signed)

## 2021-03-10 ENCOUNTER — Other Ambulatory Visit: Payer: Self-pay

## 2021-03-10 ENCOUNTER — Ambulatory Visit (INDEPENDENT_AMBULATORY_CARE_PROVIDER_SITE_OTHER): Admitting: Family Medicine

## 2021-03-10 ENCOUNTER — Encounter: Payer: Self-pay | Admitting: Family Medicine

## 2021-03-10 VITALS — BP 111/71 | HR 88 | Wt 145.6 lb

## 2021-03-10 DIAGNOSIS — F419 Anxiety disorder, unspecified: Secondary | ICD-10-CM

## 2021-03-10 DIAGNOSIS — Z5941 Food insecurity: Secondary | ICD-10-CM

## 2021-03-10 DIAGNOSIS — D563 Thalassemia minor: Secondary | ICD-10-CM

## 2021-03-10 DIAGNOSIS — Z3A16 16 weeks gestation of pregnancy: Secondary | ICD-10-CM

## 2021-03-10 DIAGNOSIS — R569 Unspecified convulsions: Secondary | ICD-10-CM

## 2021-03-10 DIAGNOSIS — Z3491 Encounter for supervision of normal pregnancy, unspecified, first trimester: Secondary | ICD-10-CM

## 2021-03-10 LAB — GC/CHLAMYDIA PROBE AMP (~~LOC~~) NOT AT ARMC
Chlamydia: NEGATIVE
Comment: NEGATIVE
Comment: NORMAL
Neisseria Gonorrhea: NEGATIVE

## 2021-03-10 NOTE — Progress Notes (Signed)
° ° °  Subjective:  Donna Guzman is a 21 y.o. G2P0010 at [redacted]w[redacted]d being seen today for ongoing prenatal care.  She is currently monitored for the following issues for this high-risk pregnancy and has Seizure ([place]); Suicidal ideation; Supervision of low-risk pregnancy; Ovarian cyst; Anemia affecting pregnancy in first trimester; and Alpha thalassemia silent carrier on their problem list. Accompanied by both of her sisters.   Patient reports nausea. Doesn't feel like zofran helps and hasn't picked up phenergan yet. 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  She also reports feeling like she is more irritated and on edge since pregnancy. She endorses of a history of being diagnosed with Bipolar and anxiety in [place], not currently on meds. States she often feels sad "but I don't cry." No SI/HI. Would be interested in therapy.    Contractions: Not present. Vag. Bleeding: None.  Movement: Present. Denies leaking of fluid.   The following portions of the patient's history were reviewed and updated as appropriate: allergies, current medications, past family history, past medical history, past social history, past surgical history and problem list.   Objective:   Vitals:   03/10/21 1424  BP: 111/71  Pulse: 88  Weight: 145 lb 9.6 oz (66 kg)    Fetal Status: Fetal Heart Rate (bpm): 168   Movement: Present     General:  Alert, oriented and cooperative. Patient is in no acute distress.  Skin: Skin is warm and dry. No rash noted.   Cardiovascular: Normal heart rate noted  Respiratory: Normal respiratory effort, no problems with respiration noted  Abdomen: Soft, gravid, appropriate for gestational age. Pain/Pressure: Present     Pelvic:  Cervical exam deferred        Extremities: Normal range of motion.  Edema: None  Mental Status: Normal mood and affect. Normal behavior. Normal judgment and thought content.    Assessment and Plan:  Pregnancy: G2P0010 at [redacted]w[redacted]d  1. Encounter for supervision of  low-risk pregnancy in first trimester Doing well. Noted several visits to the MAU, emphasized availability to send MyChart visits or call the office with non-emergent concerns.   2. [redacted] weeks gestation of pregnancy Anatomy US on 2/27. AFP collected.   3. Food insecurity --referral to [employer]   4. Seizure ([place]) On keppra without seizures. Cont as prescribed.   5. Alpha thalassemia silent carrier  6. Nausea Discussed small frequent meals and sea bands. Start trial of phenergan.   7. Anxiety   Possible Bipolar disorder  Reports diagnosed in a "psychiatric" place in [place] 1-2 years ago. Feeling more irritated and depressed since pregnancy. Interested in speaking with Roselyn Reef, referral placed.   Preterm labor symptoms and general obstetric precautions including but not limited to vaginal bleeding, contractions, leaking of fluid and fetal movement were reviewed in detail with the patient. Please refer to After Visit Summary for other counseling recommendations.   Return in about 4 weeks (around 04/07/2021) for [place].   Patriciaann Clan, DO

## 2021-03-12 LAB — AFP, SERUM, OPEN SPINA BIFIDA
AFP MoM: 1.06
AFP Value: 41.6 ng/mL
Gest. Age on Collection Date: 16 weeks
Maternal Age At EDD: 22.1 yr
OSBR Risk 1 IN: 10000
Test Results:: NEGATIVE
Weight: 145 [lb_av]

## 2021-03-17 NOTE — BH Specialist Note (Signed)
Pt did not arrive to video visit and did not answer the phone; Left HIPPA-compliant message to call back Nyal Schachter from [facility] at  [phone] (Alondria Mousseau's office).  ?; left MyChart message for patient.  ? ?

## 2021-03-24 ENCOUNTER — Encounter (HOSPITAL_COMMUNITY): Payer: Self-pay | Admitting: Obstetrics & Gynecology

## 2021-03-24 ENCOUNTER — Inpatient Hospital Stay (HOSPITAL_COMMUNITY)
Admission: AD | Admit: 2021-03-24 | Discharge: 2021-03-24 | Disposition: A | Discharge status: Home / Self Care | Attending: Obstetrics & Gynecology | Admitting: Obstetrics & Gynecology

## 2021-03-24 ENCOUNTER — Other Ambulatory Visit: Payer: Self-pay

## 2021-03-24 DIAGNOSIS — N949 Unspecified condition associated with female genital organs and menstrual cycle: Secondary | ICD-10-CM | POA: Diagnosis not present

## 2021-03-24 DIAGNOSIS — O26892 Other specified pregnancy related conditions, second trimester: Secondary | ICD-10-CM | POA: Diagnosis not present

## 2021-03-24 DIAGNOSIS — Z3A18 18 weeks gestation of pregnancy: Secondary | ICD-10-CM | POA: Diagnosis not present

## 2021-03-24 DIAGNOSIS — R102 Pelvic and perineal pain: Secondary | ICD-10-CM | POA: Insufficient documentation

## 2021-03-24 LAB — URINALYSIS, ROUTINE W REFLEX MICROSCOPIC
Bilirubin Urine: NEGATIVE
Glucose, UA: NEGATIVE mg/dL
Hgb urine dipstick: NEGATIVE
Ketones, ur: 20 mg/dL — AB
Leukocytes,Ua: NEGATIVE
Nitrite: NEGATIVE
Protein, ur: NEGATIVE mg/dL
Specific Gravity, Urine: 1.024 (ref 1.005–1.030)
pH: 6 (ref 5.0–8.0)

## 2021-03-24 NOTE — MAU Note (Signed)
Donna Guzman is a 21 y.o. at 105w0d here in MAU reporting: abdominal pain for the past week. Having some pain when she breaths in. Has taken tylenol, last dose was at 7 this AM, took 2 extra strength, no relief. No bleeding or LOF.   Onset of complaint: ongoing  Pain score: abdomen 8/10, chest 6/10  Vitals:   03/24/21 1429  BP: 111/61  Pulse: 75  Resp: 16  Temp: 98.1 F (36.7 C)  SpO2: 100%     FHT:165  Lab orders placed from triage: UA

## 2021-03-24 NOTE — MAU Provider Note (Signed)
History     CSN: [id number]  Arrival date and time: 03/24/21 1357     Chief Complaint  Patient presents with   Abdominal Pain   Chest Pain   HPI This is a 21yo G2P0010 at [redacted]w[redacted]d who presents with lower abdominal cramping pain that is nonradiating.  Worse with activity and improved with rest.  Pain is fairly constant.  No nausea, vomiting, constipation, dysuria, frequency.  No fevers, chills.  No vaginal bleeding.  OB History     Gravida  2   Para  0   Term  0   Preterm  0   AB  1   Living  0      SAB  0   IAB  1   Ectopic  0   Multiple  0   Live Births  0           Past Medical History:  Diagnosis Date   Seizures (HCC)     Past Surgical History:  Procedure Laterality Date   LACERATION REPAIR     forehead    Family History  Problem Relation Age of Onset   Diabetes Mother    Hypertension Mother    Hypertension Father    ADD / ADHD Sister     Social History   Tobacco Use   Smoking status: Former    Types: Cigars   Smokeless tobacco: Former  Building services engineer Use: Former  Substance Use Topics   Alcohol use: Not Currently   Drug use: Not Currently    Types: Marijuana    Comment: last use was at beginning of pregancy    Allergies: No Known Allergies  Medications Prior to Admission  Medication Sig Dispense Refill Last Dose   levETIRAcetam (KEPPRA) 500 MG tablet Take 1 tablet (500 mg total) by mouth 2 (two) times daily. 60 tablet 0 03/24/2021   Prenatal MV & Min w/FA-DHA (PRENATAL GUMMIES PO) Take 1 tablet by mouth daily.   03/24/2021   cyclobenzaprine (FLEXERIL) 10 MG tablet Take 1 tablet (10 mg total) by mouth 2 (two) times daily as needed for muscle spasms. 20 tablet 0 03/22/2021   docusate sodium (COLACE) 100 MG capsule Take 1 capsule (100 mg total) by mouth 2 (two) times daily as needed. 30 capsule 2    iron polysaccharides (NIFEREX) 150 MG capsule Take 1 capsule (150 mg total) by mouth every other day. 15 capsule 0     metroNIDAZOLE (FLAGYL) 500 MG tablet Take 1 tablet (500 mg total) by mouth 2 (two) times daily. 14 tablet 0 03/21/2021   ondansetron (ZOFRAN) 8 MG tablet Take 1 tablet (8 mg total) by mouth every 8 (eight) hours as needed for nausea or vomiting. 30 tablet 0    Prenatal Vit-Fe Fumarate-FA (PREPLUS) 27-1 MG TABS Take 1 tablet by mouth daily. (Patient not taking: Reported on 02/14/2021) 30 tablet 13    promethazine (PHENERGAN) 25 MG tablet Take 1 tablet (25 mg total) by mouth every 6 (six) hours as needed for nausea or vomiting. 30 tablet 0     Review of Systems Physical Exam   Blood pressure (!) 111/54, pulse 80, temperature 98.1 F (36.7 C), temperature source Oral, resp. rate 16, height 5\' 2"  (1.575 m), weight 67 kg, last menstrual period 11/18/2020, SpO2 100 %.  Physical Exam Vitals and nursing note reviewed.  Constitutional:      Appearance: She is well-developed.  Cardiovascular:     Rate and Rhythm: Normal rate and regular  rhythm.  Pulmonary:     Effort: Pulmonary effort is normal.     Breath sounds: Normal breath sounds.  Abdominal:     Tenderness: There is abdominal tenderness (tenderness on sides of uterus). There is no right CVA tenderness or left CVA tenderness.  Skin:    General: Skin is warm and dry.     Capillary Refill: Capillary refill takes less than 2 seconds.  Neurological:     Mental Status: She is alert.    MAU Course  Procedures Limited bedside ultrasound done.  Good fetal activity with good fetal heart rate seen.  MDM  Assessment and Plan   1. [redacted] weeks gestation of pregnancy   2. Round ligament pain    Supportive care discussed.  Patient sent home.  Levie Heritage 03/24/2021, 3:21 PM

## 2021-03-28 ENCOUNTER — Ambulatory Visit: Payer: Self-pay | Admitting: Clinical

## 2021-03-28 DIAGNOSIS — Z91199 Patient's noncompliance with other medical treatment and regimen due to unspecified reason: Secondary | ICD-10-CM

## 2021-03-31 ENCOUNTER — Ambulatory Visit

## 2021-03-31 DIAGNOSIS — D563 Thalassemia minor: Secondary | ICD-10-CM

## 2021-04-07 ENCOUNTER — Encounter: Admitting: Advanced Practice Midwife

## 2022-02-25 ENCOUNTER — Emergency Department (HOSPITAL_COMMUNITY)
Admission: EM | Admit: 2022-02-25 | Discharge: 2022-02-25 | Discharge status: Against Medical Advice | Source: Home / Self Care

## 2022-02-25 ENCOUNTER — Other Ambulatory Visit: Payer: Self-pay

## 2022-02-25 ENCOUNTER — Ambulatory Visit: Payer: Self-pay | Admitting: *Deleted

## 2022-02-25 ENCOUNTER — Encounter (HOSPITAL_COMMUNITY): Payer: Self-pay | Admitting: Emergency Medicine

## 2022-02-25 ENCOUNTER — Emergency Department (HOSPITAL_COMMUNITY)
Admission: EM | Admit: 2022-02-25 | Discharge: 2022-02-26 | Disposition: A | Discharge status: Home / Self Care | Attending: Emergency Medicine | Admitting: Emergency Medicine

## 2022-02-25 DIAGNOSIS — N939 Abnormal uterine and vaginal bleeding, unspecified: Secondary | ICD-10-CM

## 2022-02-25 DIAGNOSIS — R102 Pelvic and perineal pain: Secondary | ICD-10-CM | POA: Diagnosis not present

## 2022-02-25 DIAGNOSIS — Z1152 Encounter for screening for COVID-19: Secondary | ICD-10-CM | POA: Insufficient documentation

## 2022-02-25 DIAGNOSIS — G40909 Epilepsy, unspecified, not intractable, without status epilepticus: Secondary | ICD-10-CM | POA: Insufficient documentation

## 2022-02-25 DIAGNOSIS — R509 Fever, unspecified: Secondary | ICD-10-CM

## 2022-02-25 DIAGNOSIS — R569 Unspecified convulsions: Secondary | ICD-10-CM | POA: Diagnosis present

## 2022-02-25 DIAGNOSIS — Z8742 Personal history of other diseases of the female genital tract: Secondary | ICD-10-CM | POA: Diagnosis not present

## 2022-02-25 LAB — RESP PANEL BY RT-PCR (RSV, FLU A&B, COVID)  RVPGX2
Influenza A by PCR: NEGATIVE
Influenza B by PCR: NEGATIVE
Resp Syncytial Virus by PCR: NEGATIVE
SARS Coronavirus 2 by RT PCR: NEGATIVE

## 2022-02-25 LAB — CBC WITH DIFFERENTIAL/PLATELET
Abs Immature Granulocytes: 0.08 10*3/uL — ABNORMAL HIGH (ref 0.00–0.07)
Basophils Absolute: 0 10*3/uL (ref 0.0–0.1)
Basophils Relative: 0 %
Eosinophils Absolute: 0 10*3/uL (ref 0.0–0.5)
Eosinophils Relative: 0 %
HCT: 30.2 % — ABNORMAL LOW (ref 36.0–46.0)
Hemoglobin: 9.1 g/dL — ABNORMAL LOW (ref 12.0–15.0)
Immature Granulocytes: 1 %
Lymphocytes Relative: 10 %
Lymphs Abs: 1.4 10*3/uL (ref 0.7–4.0)
MCH: 21.9 pg — ABNORMAL LOW (ref 26.0–34.0)
MCHC: 30.1 g/dL (ref 30.0–36.0)
MCV: 72.8 fL — ABNORMAL LOW (ref 80.0–100.0)
Monocytes Absolute: 1 10*3/uL (ref 0.1–1.0)
Monocytes Relative: 7 %
Neutro Abs: 11.9 10*3/uL — ABNORMAL HIGH (ref 1.7–7.7)
Neutrophils Relative %: 82 %
Platelets: 322 10*3/uL (ref 150–400)
RBC: 4.15 MIL/uL (ref 3.87–5.11)
RDW: 15.1 % (ref 11.5–15.5)
WBC: 14.5 10*3/uL — ABNORMAL HIGH (ref 4.0–10.5)
nRBC: 0 % (ref 0.0–0.2)

## 2022-02-25 LAB — CBG MONITORING, ED: Glucose-Capillary: 86 mg/dL (ref 70–99)

## 2022-02-25 NOTE — ED Provider Triage Note (Signed)
Emergency Medicine Provider Triage Evaluation Note  Donna Guzman , a 22 y.o. female  was evaluated in triage.  Pt complains of seizure, brought in by EMS.  Patient is not answering questions well tied into her cell phone.  She endorses abdominal pain, vaginal bleeding status post surgical abortion 3 weeks ago.  Denies any missed doses of her antiepileptic medicine. Endorses fevers and chills at home.   Review of Systems  Per HPI  Physical Exam  BP 120/84   Pulse (!) 125   Temp 100.2 F (37.9 C) (Oral)   Resp 16   Ht 5\' 2"  (1.575 m)   Wt 67 kg   SpO2 93%   BMI 27.02 kg/m  Gen:   Awake, no distress   Resp:  Normal effort MSK:   Moves extremities without difficulty  Other:  Abdomen not particularly tender, typing into her cell phone in no acute distress  Medical Decision Making  Medically screening exam initiated at 10:36 PM.  Appropriate orders placed.  Nakaya Mishkin was informed that the remainder of the evaluation will be completed by another provider, this initial triage assessment does not replace that evaluation, and the importance of remaining in the ED until their evaluation is complete.  Tachycardic and febrile. Possible retained products, will proceed with an ultrasound for evaluation as well as basic labs given reported seizure.   Sherrill Raring, PA-C 02/25/22 2238

## 2022-02-25 NOTE — Telephone Encounter (Signed)
  Chief Complaint: Vaginal Bleeding Symptoms: [facility]Ad surgical abortion 3 weeks ago, been bleeding since but worsening since yesterday "Saturating a pad every hour." Abdominal pain lower abdomen, chills, vomiting. Noted clots yesterday Frequency: Worsening yesterday Pertinent Negatives: Patient denies  Disposition: [x] ED /[] Urgent Care (no appt availability in office) / [] Appointment(In office/virtual)/ []  [place] [facility]/ [] Home Care/ [] Refused Recommended Disposition /[] [place] [facility]/ []  Follow-up with PCP Additional Notes: No PCP. Advised ED, states will follow disposition. HAs driver, care advise provided, verbalizes understanding. Answer Assessment - Initial Assessment Questions 1. AMOUNT: "Describe the bleeding that you are having."    - SPOTTING: spotting, or pinkish / brownish mucous discharge; does not fill panty liner or pad    - MILD:  less than 1 pad / hour; less than patient's usual menstrual bleeding   - MODERATE: 1-2 pads / hour; 1 menstrual cup every 6 hours; small-medium blood clots (e.g., pea, grape, small coin)   - SEVERE: soaking 2 or more pads/hour for 2 or more hours; 1 menstrual cup every 2 hours; bleeding not contained by pads or continuous red blood from vagina; large blood clots (e.g., golf ball, large coin)      Soaking pad every hour 2. ONSET: "When did the bleeding begin?" "Is it continuing now?"     Worsening yesterday 3. MENSTRUAL PERIOD: "When was the last normal menstrual period?" "How is this different than your period?"      4. REGULARITY: "How regular are your periods?"      5. ABDOMEN PAIN: "Do you have any pain?" "How bad is the pain?"  (e.g., Scale 1-10; mild, moderate, or severe)   - MILD (1-3): doesn't interfere with normal activities, abdomen soft and not tender to touch    - MODERATE (4-7): interferes with normal activities or awakens from sleep, abdomen tender to touch    - SEVERE (8-10): excruciating pain, doubled over, unable to do any  normal activities       6. PREGNANCY: "Is there any chance you are pregnant?" "When was your last menstrual period?"      7. BREASTFEEDING: "Are you breastfeeding?"      8. HORMONE MEDICINES: "Are you taking any hormone medicines, prescription or over-the-counter?" (e.g., birth control pills, estrogen)      9. BLOOD THINNER MEDICINES: "Do you take any blood thinners?" (e.g., Coumadin / warfarin, Pradaxa / dabigatran, aspirin)     NO 10. CAUSE: "What do you think is causing the bleeding?" (e.g., recent gyn surgery, recent gyn procedure; known bleeding disorder, cervical cancer, polycystic ovarian disease, fibroids)         *No Answer* 11. HEMODYNAMIC STATUS: "Are you weak or feeling lightheaded?" If Yes, ask: "Can you stand and walk normally?"        *No Answer* 12. OTHER SYMPTOMS: "What other symptoms are you having with the bleeding?" (e.g., passed tissue, vaginal discharge, fever, menstrual-type cramps)       Chills, vomiting, abdominal pain,lower abdomen  Protocols used: Vaginal Bleeding - Abnormal-A-AH

## 2022-02-25 NOTE — ED Triage Notes (Signed)
Pt bib [facility] from work for witnessed grand mal seizure lasting approx 2 minutes. No trauma during seizure. Hx of seizures - last seizure was last month. On keppra with no missed doses. Pt alert and following commands on arrival but nonverbal.

## 2022-02-26 ENCOUNTER — Emergency Department (HOSPITAL_COMMUNITY)

## 2022-02-26 DIAGNOSIS — Z8742 Personal history of other diseases of the female genital tract: Secondary | ICD-10-CM

## 2022-02-26 DIAGNOSIS — R102 Pelvic and perineal pain: Secondary | ICD-10-CM

## 2022-02-26 LAB — URINALYSIS, ROUTINE W REFLEX MICROSCOPIC
Bilirubin Urine: NEGATIVE
Glucose, UA: NEGATIVE mg/dL
Ketones, ur: 20 mg/dL — AB
Leukocytes,Ua: NEGATIVE
Nitrite: NEGATIVE
Protein, ur: 30 mg/dL — AB
Specific Gravity, Urine: 1.027 (ref 1.005–1.030)
pH: 6 (ref 5.0–8.0)

## 2022-02-26 LAB — RAPID URINE DRUG SCREEN, HOSP PERFORMED
Amphetamines: NOT DETECTED
Barbiturates: NOT DETECTED
Benzodiazepines: NOT DETECTED
Cocaine: NOT DETECTED
Opiates: NOT DETECTED
Tetrahydrocannabinol: NOT DETECTED

## 2022-02-26 LAB — COMPREHENSIVE METABOLIC PANEL
ALT: 11 U/L (ref 0–44)
AST: 14 U/L — ABNORMAL LOW (ref 15–41)
Albumin: 3.3 g/dL — ABNORMAL LOW (ref 3.5–5.0)
Alkaline Phosphatase: 45 U/L (ref 38–126)
Anion gap: 9 (ref 5–15)
BUN: 6 mg/dL (ref 6–20)
CO2: 21 mmol/L — ABNORMAL LOW (ref 22–32)
Calcium: 8.7 mg/dL — ABNORMAL LOW (ref 8.9–10.3)
Chloride: 105 mmol/L (ref 98–111)
Creatinine, Ser: 0.82 mg/dL (ref 0.44–1.00)
GFR, Estimated: >60 mL/min (ref >60)
Glucose, Bld: 91 mg/dL (ref 70–99)
Potassium: 3 mmol/L — ABNORMAL LOW (ref 3.5–5.1)
Sodium: 135 mmol/L (ref 135–145)
Total Bilirubin: 0.8 mg/dL (ref 0.3–1.2)
Total Protein: 7.2 g/dL (ref 6.5–8.1)

## 2022-02-26 LAB — WET PREP, GENITAL
Clue Cells Wet Prep HPF POC: NONE SEEN
Sperm: NONE SEEN
Trich, Wet Prep: NONE SEEN
WBC, Wet Prep HPF POC: >=10 — AB (ref <10)
Yeast Wet Prep HPF POC: NONE SEEN

## 2022-02-26 LAB — I-STAT BETA HCG BLOOD, ED (MC, WL, AP ONLY): I-stat hCG, quantitative: 50.2 m[IU]/mL — ABNORMAL HIGH (ref <5)

## 2022-02-26 MED ORDER — DOXYCYCLINE MONOHYDRATE 100 MG PO CAPS: 100.0000 mg | ORAL_CAPSULE | Freq: Two times a day (BID) | ORAL | 0 refills | Status: AC

## 2022-02-26 MED ORDER — SODIUM CHLORIDE 0.9 % IV SOLN
100.0000 mg | Freq: Once | INTRAVENOUS | Status: AC
  Administered 2022-02-26: 100 mg via INTRAVENOUS
  Filled 2022-02-26: qty 100

## 2022-02-26 MED ORDER — METRONIDAZOLE 500 MG PO TABS: 500.0000 mg | ORAL_TABLET | Freq: Two times a day (BID) | ORAL | 0 refills | Status: AC

## 2022-02-26 MED ORDER — METRONIDAZOLE 500 MG/100ML IV SOLN
500.0000 mg | Freq: Two times a day (BID) | INTRAVENOUS | Status: DC
  Administered 2022-02-26: 500 mg via INTRAVENOUS
  Filled 2022-02-26: qty 100

## 2022-02-26 MED ORDER — POTASSIUM CHLORIDE CRYS ER 20 MEQ PO TBCR: EXTENDED_RELEASE_TABLET | ORAL | 0 refills | Status: AC

## 2022-02-26 MED ORDER — LEVETIRACETAM IN NACL 1000 MG/100ML IV SOLN
1000.0000 mg | Freq: Once | INTRAVENOUS | Status: AC
  Administered 2022-02-26: 1000 mg via INTRAVENOUS
  Filled 2022-02-26: qty 100

## 2022-02-26 MED ORDER — ACETAMINOPHEN 500 MG PO TABS
1000.0000 mg | ORAL_TABLET | Freq: Once | ORAL | Status: AC
  Administered 2022-02-26: 1000 mg via ORAL
  Filled 2022-02-26: qty 2

## 2022-02-26 MED ORDER — SODIUM CHLORIDE 0.9 % IV BOLUS
1000.0000 mL | Freq: Once | INTRAVENOUS | Status: AC
  Administered 2022-02-26: 1000 mL via INTRAVENOUS

## 2022-02-26 NOTE — ED Provider Notes (Signed)
[place] EMERGENCY DEPARTMENT AT [facility] Provider Note   CSN: [id number] Arrival date & time: 02/25/22  2221     History  Chief Complaint  Patient presents with   Seizures    Donna Guzman is a 22 y.o. female.  Pt presents with seizure, hx seizure disorder, and was noted to have generalized seizure lasting 1-2 min.  Pt currently alert, oriented. Denies recent increase in sz frequency. Indicates compliant w home meds. Also indicates ~ 3 weeks ago had elective ab in [place], and that has recurrent vaginal bleeding since.  Indicates waxing and waning in amount, will change tampon/pad ~ q 1  hr, then will lessen. Intermittent lower abd cramping. No constant and/or severe abdominal or pelvic pain. Low grade fever in ED. Denies unusual vaginal discharge or odor. No dysuria or urgency. No cough or sob. No faintness or dizziness.   The history is provided by the patient, medical records and the EMS personnel.  Seizures      Home Medications Prior to Admission medications   Medication Sig Start Date End Date Taking? Authorizing Provider  cyclobenzaprine (FLEXERIL) 10 MG tablet Take 1 tablet (10 mg total) by mouth 2 (two) times daily as needed for muscle spasms. 03/01/21   Rolm Bookbinder, CNM  iron polysaccharides (NIFEREX) 150 MG capsule Take 1 capsule (150 mg total) by mouth every other day. 02/16/21 03/18/21  Calvert Cantor, CNM  levETIRAcetam (KEPPRA) 500 MG tablet Take 1 tablet (500 mg total) by mouth 2 (two) times daily. 02/14/21   Zadie Rhine, MD  metroNIDAZOLE (FLAGYL) 500 MG tablet Take 1 tablet (500 mg total) by mouth 2 (two) times daily. 03/08/21   Rolm Bookbinder, CNM  Prenatal MV & Min w/FA-DHA (PRENATAL GUMMIES PO) Take 1 tablet by mouth daily.    [provider]      Allergies    Patient has no known allergies.    Review of Systems   Review of Systems  Constitutional:  Positive for fever.  HENT:  Negative for sore throat.   Eyes:   Negative for redness.  Respiratory:  Negative for cough and shortness of breath.   Cardiovascular:  Negative for chest pain.  Gastrointestinal:  Negative for blood in stool, diarrhea and vomiting.  Genitourinary:  Negative for dysuria and flank pain.  Musculoskeletal:  Negative for back pain.  Skin:  Negative for rash.  Neurological:  Positive for seizures. Negative for syncope, light-headedness and headaches.  Hematological:  Does not bruise/bleed easily.  Psychiatric/Behavioral:  Negative for confusion.     Physical Exam Updated Vital Signs BP 121/80 (BP Location: Right Arm)   Pulse (!) 104   Temp 99.4 F (37.4 C) (Oral)   Resp 10   Ht 1.575 m (5\' 2" )   Wt 67 kg   SpO2 100%   BMI 27.02 kg/m  Physical Exam Vitals and nursing note reviewed.  Constitutional:      Appearance: Normal appearance. She is well-developed.  HENT:     Head: Atraumatic.     Nose: Nose normal.     Mouth/Throat:     Mouth: Mucous membranes are moist.  Eyes:     General: No scleral icterus.    Conjunctiva/sclera: Conjunctivae normal.     Pupils: Pupils are equal, round, and reactive to light.  Neck:     Trachea: No tracheal deviation.  Cardiovascular:     Rate and Rhythm: Normal rate and regular rhythm.     Pulses: Normal pulses.  Heart sounds: Normal heart sounds. No murmur heard.    No friction rub. No gallop.  Pulmonary:     Effort: Pulmonary effort is normal. No respiratory distress.     Breath sounds: Normal breath sounds.  Abdominal:     General: Bowel sounds are normal. There is no distension.     Palpations: Abdomen is soft. There is no mass.     Tenderness: There is no abdominal tenderness. There is no guarding.  Genitourinary:    Comments: No cva tenderness.  Chaperoned pelvic exam w nurse - no blood in vagina and not active bleeding. No purulent discharge from cervix. Mild cervical tenderness. No focal adnexal mass or tenderness.  Musculoskeletal:        General: No swelling or  tenderness.     Cervical back: Normal range of motion and neck supple. No rigidity or tenderness. No muscular tenderness.     Right lower leg: No edema.     Left lower leg: No edema.  Skin:    General: Skin is warm and dry.     Findings: No rash.  Neurological:     Mental Status: She is alert.     Comments: Alert, speech normal. Motor/sens grossly intact bil.   Psychiatric:        Mood and Affect: Mood normal.     ED Results / Procedures / Treatments   Labs (all labs ordered are listed, but only abnormal results are displayed) Results for orders placed or performed during the hospital encounter of 02/25/22  Resp panel by RT-PCR (RSV, Flu A&B, Covid) Anterior Nasal Swab   Specimen: Anterior Nasal Swab  Result Value Ref Range   SARS Coronavirus 2 by RT PCR NEGATIVE NEGATIVE   Influenza A by PCR NEGATIVE NEGATIVE   Influenza B by PCR NEGATIVE NEGATIVE   Resp Syncytial Virus by PCR NEGATIVE NEGATIVE  Comprehensive metabolic panel  Result Value Ref Range   Sodium 135 135 - 145 mmol/L   Potassium 3.0 (L) 3.5 - 5.1 mmol/L   Chloride 105 98 - 111 mmol/L   CO2 21 (L) 22 - 32 mmol/L   Glucose, Bld 91 70 - 99 mg/dL   BUN 6 6 - 20 mg/dL   Creatinine, Ser 5.17 0.44 - 1.00 mg/dL   Calcium 8.7 (L) 8.9 - 10.3 mg/dL   Total Protein 7.2 6.5 - 8.1 g/dL   Albumin 3.3 (L) 3.5 - 5.0 g/dL   AST 14 (L) 15 - 41 U/L   ALT 11 0 - 44 U/L   Alkaline Phosphatase 45 38 - 126 U/L   Total Bilirubin 0.8 0.3 - 1.2 mg/dL   GFR, Estimated >61 >60 mL/min   Anion gap 9 5 - 15  CBC with Differential/Platelet  Result Value Ref Range   WBC 14.5 (H) 4.0 - 10.5 K/uL   RBC 4.15 3.87 - 5.11 MIL/uL   Hemoglobin 9.1 (L) 12.0 - 15.0 g/dL   HCT 73.7 (L) 10.6 - 26.9 %   MCV 72.8 (L) 80.0 - 100.0 fL   MCH 21.9 (L) 26.0 - 34.0 pg   MCHC 30.1 30.0 - 36.0 g/dL   RDW 48.5 46.2 - 70.3 %   Platelets 322 150 - 400 K/uL   nRBC 0.0 0.0 - 0.2 %   Neutrophils Relative % 82 %   Neutro Abs 11.9 (H) 1.7 - 7.7 K/uL    Lymphocytes Relative 10 %   Lymphs Abs 1.4 0.7 - 4.0 K/uL   Monocytes Relative 7 %  Monocytes Absolute 1.0 0.1 - 1.0 K/uL   Eosinophils Relative 0 %   Eosinophils Absolute 0.0 0.0 - 0.5 K/uL   Basophils Relative 0 %   Basophils Absolute 0.0 0.0 - 0.1 K/uL   Immature Granulocytes 1 %   Abs Immature Granulocytes 0.08 (H) 0.00 - 0.07 K/uL  Urinalysis, Routine w reflex microscopic -Urine, Clean Catch  Result Value Ref Range   Color, Urine AMBER (A) YELLOW   APPearance HAZY (A) CLEAR   Specific Gravity, Urine 1.027 1.005 - 1.030   pH 6.0 5.0 - 8.0   Glucose, UA NEGATIVE NEGATIVE mg/dL   Hgb urine dipstick MODERATE (A) NEGATIVE   Bilirubin Urine NEGATIVE NEGATIVE   Ketones, ur 20 (A) NEGATIVE mg/dL   Protein, ur 30 (A) NEGATIVE mg/dL   Nitrite NEGATIVE NEGATIVE   Leukocytes,Ua NEGATIVE NEGATIVE   RBC / HPF 0-5 0 - 5 RBC/hpf   WBC, UA 11-20 0 - 5 WBC/hpf   Bacteria, UA FEW (A) NONE SEEN   Squamous Epithelial / HPF 0-5 0 - 5 /HPF   Mucus PRESENT   CBG monitoring, ED  Result Value Ref Range   Glucose-Capillary 86 70 - 99 mg/dL  I-Stat beta hCG blood, ED  Result Value Ref Range   I-stat hCG, quantitative 50.2 (H) <5 mIU/mL   Comment 3           US OB Comp < 14 Wks  Result Date: 02/26/2022 CLINICAL DATA:  Recent abortion. EXAM: OBSTETRIC <14 WK [place] AND TRANSVAGINAL OB US TECHNIQUE: Both transabdominal and transvaginal ultrasound examinations were performed for complete evaluation of the gestation as well as the maternal uterus, adnexal regions, and pelvic cul-de-sac. Transvaginal technique was performed to assess early pregnancy. COMPARISON:  Pelvic ultrasound dated 02/16/2021. FINDINGS: The uterus is anteverted and appears unremarkable. There is a complex predominantly hypoechoic area in the fundal endometrium measuring approximately 1.0 x 1.6 x 0.8 cm. No internal vascularity noted within this region. This may represent postsurgical fluid and debris. Retained products of conception is  not entirely excluded. Clinical correlation is recommended. The ovaries are unremarkable. No free fluid within the pelvis. IMPRESSION: Complex collection in the fundal endometrium may represent postsurgical changes. Retained product of conception is not entirely excluded. Clinical correlation is recommended. Electronically Signed   By: Anner Crete M.D.   On: 02/26/2022 03:10   US OB Transvaginal  Result Date: 02/26/2022 CLINICAL DATA:  Recent abortion. EXAM: OBSTETRIC <14 WK [place] AND TRANSVAGINAL OB US TECHNIQUE: Both transabdominal and transvaginal ultrasound examinations were performed for complete evaluation of the gestation as well as the maternal uterus, adnexal regions, and pelvic cul-de-sac. Transvaginal technique was performed to assess early pregnancy. COMPARISON:  Pelvic ultrasound dated 02/16/2021. FINDINGS: The uterus is anteverted and appears unremarkable. There is a complex predominantly hypoechoic area in the fundal endometrium measuring approximately 1.0 x 1.6 x 0.8 cm. No internal vascularity noted within this region. This may represent postsurgical fluid and debris. Retained products of conception is not entirely excluded. Clinical correlation is recommended. The ovaries are unremarkable. No free fluid within the pelvis. IMPRESSION: Complex collection in the fundal endometrium may represent postsurgical changes. Retained product of conception is not entirely excluded. Clinical correlation is recommended. Electronically Signed   By: Anner Crete M.D.   On: 02/26/2022 03:10    EKG None  Radiology US OB Comp < 14 Wks  Result Date: 02/26/2022 CLINICAL DATA:  Recent abortion. EXAM: OBSTETRIC <14 WK [place] AND TRANSVAGINAL OB US TECHNIQUE: Both transabdominal  and transvaginal ultrasound examinations were performed for complete evaluation of the gestation as well as the maternal uterus, adnexal regions, and pelvic cul-de-sac. Transvaginal technique was performed to assess early pregnancy.  COMPARISON:  Pelvic ultrasound dated 02/16/2021. FINDINGS: The uterus is anteverted and appears unremarkable. There is a complex predominantly hypoechoic area in the fundal endometrium measuring approximately 1.0 x 1.6 x 0.8 cm. No internal vascularity noted within this region. This may represent postsurgical fluid and debris. Retained products of conception is not entirely excluded. Clinical correlation is recommended. The ovaries are unremarkable. No free fluid within the pelvis. IMPRESSION: Complex collection in the fundal endometrium may represent postsurgical changes. Retained product of conception is not entirely excluded. Clinical correlation is recommended. Electronically Signed   By: Elgie Collard M.D.   On: 02/26/2022 03:10   US OB Transvaginal  Result Date: 02/26/2022 CLINICAL DATA:  Recent abortion. EXAM: OBSTETRIC <14 WK [place] AND TRANSVAGINAL OB US TECHNIQUE: Both transabdominal and transvaginal ultrasound examinations were performed for complete evaluation of the gestation as well as the maternal uterus, adnexal regions, and pelvic cul-de-sac. Transvaginal technique was performed to assess early pregnancy. COMPARISON:  Pelvic ultrasound dated 02/16/2021. FINDINGS: The uterus is anteverted and appears unremarkable. There is a complex predominantly hypoechoic area in the fundal endometrium measuring approximately 1.0 x 1.6 x 0.8 cm. No internal vascularity noted within this region. This may represent postsurgical fluid and debris. Retained products of conception is not entirely excluded. Clinical correlation is recommended. The ovaries are unremarkable. No free fluid within the pelvis. IMPRESSION: Complex collection in the fundal endometrium may represent postsurgical changes. Retained product of conception is not entirely excluded. Clinical correlation is recommended. Electronically Signed   By: Elgie Collard M.D.   On: 02/26/2022 03:10    Procedures Procedures    Medications Ordered in  ED Medications - No data to display  ED Course/ Medical Decision Making/ A&P                             Medical Decision Making Problems Addressed: Abnormal vaginal bleeding: acute illness or injury with systemic symptoms that poses a threat to life or bodily functions Low grade fever: acute illness or injury with systemic symptoms Seizure (HCC): acute illness or injury with systemic symptoms that poses a threat to life or bodily functions Seizure disorder [facility]): chronic illness or injury with exacerbation, progression, or side effects of treatment that poses a threat to life or bodily functions  Amount and/or Complexity of Data Reviewed Independent Historian: EMS    Details: hx External Data Reviewed: notes. Labs: ordered. Decision-making details documented in ED Course. Radiology: ordered and independent interpretation performed. Decision-making details documented in ED Course. Discussion of management or test interpretation with external provider(s): Gyn physician  Risk OTC drugs. Prescription drug management. Decision regarding hospitalization.   Iv ns. Continuous pulse ox and cardiac monitoring. Labs ordered/sent. Imaging ordered.   Differential diagnosis includes  seizure/seizure disorder, retained products, anemia, etc. Dispo decision including potential need for admission considered - will get labs and imaging and reassess.   Reviewed nursing notes and prior charts for additional history. External reports reviewed.   Cardiac monitor: sinus rhythm, rate 100.  Labs reviewed/interpreted by me - wbc mildly elev. K mildly low.   U/s reviewed/interpreted by me - ?small fluid/material collection.   Iv ns bolus. Keppra iv.    Ob/gyn consulted - discussed w ob/gyn physician on call, Dr Charlotta Newton, incl recent  abx, symptoms, vitals, labs, u/s - she reviewed chart, labs, u/s, etc - and indicates d/c to home on doxy and flagyl.    Dose of abx ordered in ED.  Po fluids, food  provided (pt asking for something to eat/drink). Acetaminophen po.  Overall, pt is well, not toxic in appearance. No new c/o. No distress.  Pt currently appears stable for d/c. Return precautions provided.   CRITICAL CARE RE: seizure with parenteral seizure med therapy, acute febrile illness, dysfunctional uterine bleeding s/p elective ab Performed by: Mirna Mires Total critical care time: 40 minutes Critical care time was exclusive of separately billable procedures and treating other patients. Critical care was necessary to treat or prevent imminent or life-threatening deterioration. Critical care was time spent personally by me on the following activities: development of treatment plan with patient and/or surrogate as well as nursing, discussions with consultants, evaluation of patient's response to treatment, examination of patient, obtaining history from patient or surrogate, ordering and performing treatments and interventions, ordering and review of laboratory studies, ordering and review of radiographic studies, pulse oximetry and re-evaluation of patient's condition.        Final Clinical Impression(s) / ED Diagnoses Final diagnoses:  None    Rx / DC Orders ED Discharge Orders     None         Lajean Saver, MD 02/26/22 1311

## 2022-02-26 NOTE — Discharge Instructions (Addendum)
It was our pleasure to provide your ER care today - we hope that you feel better.  Drink plenty of fluids/stay well hydrated. Take acetaminophen as need.   Take antibiotics (doxycycline and metronidazole) as prescribed by Dr Nelda Marseille.  Follow up closely with ob-gyn doctor/clinic in the next few days - if you have not heard from them by early tomorrow afternoon, call office then to arrange appointment time.   For seizures, make sure to take your seizure medication as prescribed, and follow up closely with neurologist in one week - call office to arrange appointment. No driving, swimming, or operating heavy machinery until cleared to do so by your doctor.   Your potassium level is low - eat plenty of fruits and vegetables, take potassium supplement as prescribed, and follow up with primary care doctor in one week.  Return to the [facility] area our [facility] at [facility] (entrance is off of [address] between [place])  if symptoms worse, severe or worsening pelvic pain, increased/heavy vaginal bleeding, high fevers, weak/fainting, or other concern.

## 2022-02-26 NOTE — Consult Note (Signed)
   OB/GYN Telephone Consult  02/26/2022  Donna Guzman is a 22 y.o. G2P0010 not currently pregnant, s/p termination about 3 weeks ago.  I was called for a consult regarding the care of this patient by [facility]. [name] provider had a clinical question about management  The provider presented the following relevant clinical information: 22yo female s/p termination completed at outside facility.  Presented today due to 2 concerns 1) seizure with known h/o seizure 2) vaginal bleeding/cramping s/p termination.   Termination completed about 3 weeks ago, did not have follow up.  Pt reported heavy bleeding and cramping; however, on exam per provider minimal bleeding noted.  He did note slight cervical tenderness/discomfort.  Vitals stable, low grade temp 99 WBC 14 hCG 50  [place] completed- report stated: Complex collection in the fundal endometrium may represent postsurgical changes. Retained product of conception is not entirely excluded. Clinical correlation is recommended.  I performed a chart review on the patient and reviewed available documentation.  BP (!) 129/90   Pulse (!) 102   Temp 99.4 F (37.4 C) (Oral)   Resp (!) 32   Ht 5\' 2"  (1.575 m)   Wt 67 kg   SpO2 100%   BMI 27.02 kg/m   Exam- performed by consulting provider   Recommendations:  -low suspicion for retained products based on clinical picture and [place]; however cannot completely rule out -would advise close outpatient follow up- message sent to [facility] to schedule follow up appointment next week -concern for possible post-op infection.  Plan to treat with oral Doxy and Flagyl -Should pt note worsening symptoms- ie temp, worsening pain, bleeding, etc advise return to hospital for further evaluation/treatment  Thank you for this consult and if additional recommendations are needed please call [phone] for the OB/GYN attending on service at [facility].   I spent approximately 5  minutes directly consulting with the provider and verbally discussing this case. Additionally 10 minutes minutes was spent performing chart review and documentation.   Janyth Pupa, DO Attending [place], [facility] for [employer], [place]

## 2022-02-27 LAB — GC/CHLAMYDIA PROBE AMP (~~LOC~~) NOT AT ARMC
Chlamydia: NEGATIVE
Comment: NEGATIVE
Comment: NORMAL
Neisseria Gonorrhea: NEGATIVE

## 2022-03-03 ENCOUNTER — Ambulatory Visit: Admitting: Obstetrics and Gynecology

## 2022-05-08 ENCOUNTER — Emergency Department (HOSPITAL_COMMUNITY)

## 2022-05-08 ENCOUNTER — Encounter (HOSPITAL_COMMUNITY): Payer: Self-pay | Admitting: Emergency Medicine

## 2022-05-08 ENCOUNTER — Emergency Department (HOSPITAL_COMMUNITY)
Admission: EM | Admit: 2022-05-08 | Discharge: 2022-05-08 | Disposition: A | Discharge status: Home / Self Care | Attending: Emergency Medicine | Admitting: Emergency Medicine

## 2022-05-08 ENCOUNTER — Other Ambulatory Visit: Payer: Self-pay

## 2022-05-08 DIAGNOSIS — R112 Nausea with vomiting, unspecified: Secondary | ICD-10-CM

## 2022-05-08 DIAGNOSIS — E860 Dehydration: Secondary | ICD-10-CM | POA: Insufficient documentation

## 2022-05-08 DIAGNOSIS — R0981 Nasal congestion: Secondary | ICD-10-CM | POA: Insufficient documentation

## 2022-05-08 DIAGNOSIS — R051 Acute cough: Secondary | ICD-10-CM

## 2022-05-08 DIAGNOSIS — Z1152 Encounter for screening for COVID-19: Secondary | ICD-10-CM | POA: Diagnosis not present

## 2022-05-08 DIAGNOSIS — J029 Acute pharyngitis, unspecified: Secondary | ICD-10-CM | POA: Insufficient documentation

## 2022-05-08 LAB — CBC
HCT: 36 % (ref 36.0–46.0)
Hemoglobin: 10.9 g/dL — ABNORMAL LOW (ref 12.0–15.0)
MCH: 22 pg — ABNORMAL LOW (ref 26.0–34.0)
MCHC: 30.3 g/dL (ref 30.0–36.0)
MCV: 72.6 fL — ABNORMAL LOW (ref 80.0–100.0)
Platelets: 375 10*3/uL (ref 150–400)
RBC: 4.96 MIL/uL (ref 3.87–5.11)
RDW: 15.3 % (ref 11.5–15.5)
WBC: 7.4 10*3/uL (ref 4.0–10.5)
nRBC: 0 % (ref 0.0–0.2)

## 2022-05-08 LAB — LIPASE, BLOOD: Lipase: 24 U/L (ref 11–51)

## 2022-05-08 LAB — URINALYSIS, ROUTINE W REFLEX MICROSCOPIC
Bilirubin Urine: NEGATIVE
Glucose, UA: NEGATIVE mg/dL
Hgb urine dipstick: NEGATIVE
Ketones, ur: NEGATIVE mg/dL
Nitrite: NEGATIVE
Protein, ur: 30 mg/dL — AB
Specific Gravity, Urine: 1.02 (ref 1.005–1.030)
pH: 6 (ref 5.0–8.0)

## 2022-05-08 LAB — COMPREHENSIVE METABOLIC PANEL
ALT: 15 U/L (ref 0–44)
AST: 17 U/L (ref 15–41)
Albumin: 3.3 g/dL — ABNORMAL LOW (ref 3.5–5.0)
Alkaline Phosphatase: 51 U/L (ref 38–126)
Anion gap: 10 (ref 5–15)
BUN: <5 mg/dL — ABNORMAL LOW (ref 6–20)
CO2: 23 mmol/L (ref 22–32)
Calcium: 9.1 mg/dL (ref 8.9–10.3)
Chloride: 104 mmol/L (ref 98–111)
Creatinine, Ser: 0.77 mg/dL (ref 0.44–1.00)
GFR, Estimated: >60 mL/min (ref >60)
Glucose, Bld: 93 mg/dL (ref 70–99)
Potassium: 3.4 mmol/L — ABNORMAL LOW (ref 3.5–5.1)
Sodium: 137 mmol/L (ref 135–145)
Total Bilirubin: 0.4 mg/dL (ref 0.3–1.2)
Total Protein: 7.5 g/dL (ref 6.5–8.1)

## 2022-05-08 LAB — I-STAT BETA HCG BLOOD, ED (MC, WL, AP ONLY): I-stat hCG, quantitative: <5 m[IU]/mL (ref <5)

## 2022-05-08 LAB — SARS CORONAVIRUS 2 BY RT PCR: SARS Coronavirus 2 by RT PCR: NEGATIVE

## 2022-05-08 MED ORDER — ONDANSETRON 4 MG PO TBDP: 4.0000 mg | ORAL_TABLET | Freq: Three times a day (TID) | ORAL | 0 refills | Status: AC | PRN

## 2022-05-08 MED ORDER — SODIUM CHLORIDE 0.9 % IV BOLUS
1000.0000 mL | Freq: Once | INTRAVENOUS | Status: AC
  Administered 2022-05-08: 1000 mL via INTRAVENOUS

## 2022-05-08 MED ORDER — ONDANSETRON HCL 4 MG/2ML IJ SOLN
4.0000 mg | Freq: Once | INTRAMUSCULAR | Status: AC
  Administered 2022-05-08: 4 mg via INTRAVENOUS
  Filled 2022-05-08: qty 2

## 2022-05-08 MED ORDER — DICYCLOMINE HCL 20 MG PO TABS: 20.0000 mg | ORAL_TABLET | Freq: Three times a day (TID) | ORAL | 0 refills | Status: AC | PRN

## 2022-05-08 MED ORDER — BENZONATATE 100 MG PO CAPS: 100.0000 mg | ORAL_CAPSULE | Freq: Three times a day (TID) | ORAL | 0 refills | Status: AC | PRN

## 2022-05-08 NOTE — ED Provider Notes (Signed)
Emergency Department Provider Note   I have reviewed the triage vital signs and the nursing notes.   HISTORY  Chief Complaint Emesis   HPI Donna Guzman is a 22 y.o. female with PMH of seizure presents to the emergency department for evaluation of nausea and vomiting.  She has had some associated sore throat and nasal congestion.  Symptoms began yesterday.  Able to swallow without difficulty.  No shortness of breath or chest pain.  No significant abdominal pain.  No sick contacts.  States that she was out partying and in large groups of people.  Denies any drug use.  She did drink some alcohol.   Past Medical History:  Diagnosis Date   Seizures     Review of Systems  Constitutional: Positive chills and fatigue.  Eyes: No visual changes. ENT: Positive sore throat. Cardiovascular: Denies chest pain. Respiratory: Denies shortness of breath. Gastrointestinal: No abdominal pain. Positive nausea and vomiting.  No diarrhea.  No constipation. Genitourinary: Negative for dysuria. Musculoskeletal: Negative for back pain. Skin: Negative for rash. Neurological: Negative for headaches.  ____________________________________________   PHYSICAL EXAM:  VITAL SIGNS: ED Triage Vitals  Enc Vitals Group     BP 05/08/22 1227 135/79     Pulse Rate 05/08/22 1227 (!) 103     Resp 05/08/22 1227 18     Temp 05/08/22 1227 98.6 F (37 C)     Temp src --      SpO2 05/08/22 1227 99 %     Weight 05/08/22 1227 147 lb (66.7 kg)     Height 05/08/22 1227 5\' 2"  (1.575 m)   Constitutional: Alert and oriented. Well appearing and in no acute distress. Eyes: Conjunctivae are normal.  Head: Atraumatic. Nose: No congestion/rhinnorhea. Mouth/Throat: Mucous membranes are moist.  Neck: No stridor.   Cardiovascular: Normal rate, regular rhythm. Good peripheral circulation. Grossly normal heart sounds.   Respiratory: Normal respiratory effort.  No retractions. Lungs CTAB. Gastrointestinal: Soft and  nontender. No distention.  Musculoskeletal: No lower extremity tenderness nor edema. No gross deformities of extremities. Neurologic:  Normal speech and language. No gross focal neurologic deficits are appreciated.  Skin:  Skin is warm, dry and intact. No rash noted.  ____________________________________________   LABS (all labs ordered are listed, but only abnormal results are displayed)  Labs Reviewed  COMPREHENSIVE METABOLIC PANEL - Abnormal; Notable for the following components:      Result Value   Potassium 3.4 (*)    BUN <5 (*)    Albumin 3.3 (*)    All other components within normal limits  CBC - Abnormal; Notable for the following components:   Hemoglobin 10.9 (*)    MCV 72.6 (*)    MCH 22.0 (*)    All other components within normal limits  URINALYSIS, ROUTINE W REFLEX MICROSCOPIC - Abnormal; Notable for the following components:   Color, Urine AMBER (*)    APPearance CLOUDY (*)    Protein, ur 30 (*)    Leukocytes,Ua LARGE (*)    Bacteria, UA RARE (*)    All other components within normal limits  SARS CORONAVIRUS 2 BY RT PCR  LIPASE, BLOOD  I-STAT BETA HCG BLOOD, ED ([facility], [facility], [facility] ONLY)   ____________________________________________   PROCEDURES  Procedure(s) performed:   Procedures  None  ____________________________________________   INITIAL IMPRESSION / ASSESSMENT AND PLAN / ED COURSE  Pertinent labs & imaging results that were available during my care of the patient were reviewed by me and considered in  my medical decision making (see chart for details).   This patient is Presenting for Evaluation of vomiting, which does require a range of treatment options, and is a complaint that involves a moderate risk of morbidity and mortality.  The Differential Diagnoses include includes but is not exclusive to community-acquired pneumonia, urinary tract infection, acute cholecystitis, viral syndrome, cellulitis, tick bourne disease,  decubitus ulcer, necrotizing  fasciitis, meningitis, encephalitis, influenza, etc.   Critical Interventions-    Medications  sodium chloride 0.9 % bolus 1,000 mL (0 mLs Intravenous Stopped 05/08/22 1610)  ondansetron (ZOFRAN) injection 4 mg (4 mg Intravenous Given 05/08/22 1353)    Reassessment after intervention: symptoms improved.    Clinical Laboratory Tests Ordered, included UA with rare bacteria and leukocytes.  No UTI symptoms.  CBC without leukocytosis.  No acute kidney injury.  LFTs and lipase normal.  COVID-negative.  Radiologic Tests Ordered, included CXR. I independently interpreted the images and agree with radiology interpretation.   Cardiac Monitor Tracing which shows NSR.    Social Determinants of Health Risk occasional EtOH.   Medical Decision Making: Summary:  Patient presents emergency department with flulike symptoms with mainly respiratory component of symptoms.  Some chills at home and vomiting.  Feeling improved here after IV fluids and Zofran.  Vitals and overall presentation not consistent with SIRS or developing sepsis.  Equivocal UA but no UTI symptoms.  No back pain to suspect developing pyelonephritis.   Reevaluation with update and discussion with patient feeling much better on reassessment.  She is tolerating PO fluids. Plan for continued symptom mgmt at home with close PCP follow up and strict ED return precautions.   Patient's presentation is most consistent with acute, uncomplicated illness.   Disposition: discharge  ____________________________________________  FINAL CLINICAL IMPRESSION(S) / ED DIAGNOSES  Final diagnoses:  Dehydration  Nausea and vomiting, unspecified vomiting type  Acute cough     NEW OUTPATIENT MEDICATIONS STARTED DURING THIS VISIT:  Discharge Medication List as of 05/08/2022  4:13 PM     START taking these medications   Details  benzonatate (TESSALON) 100 MG capsule Take 1 capsule (100 mg total) by mouth 3 (three) times daily as needed for cough.,  Starting Fri 05/08/2022, Normal    dicyclomine (BENTYL) 20 MG tablet Take 1 tablet (20 mg total) by mouth 3 (three) times daily as needed for spasms., Starting Fri 05/08/2022, Normal    ondansetron (ZOFRAN-ODT) 4 MG disintegrating tablet Take 1 tablet (4 mg total) by mouth every 8 (eight) hours as needed for nausea or vomiting., Starting Fri 05/08/2022, Normal        Note:  This document was prepared using Dragon voice recognition software and may include unintentional dictation errors.  Alona BeneJoshua Rawson Minix, MD, [facility] Emergency Medicine    Radhika Dershem, Arlyss RepressJoshua G, MD 05/11/22 [phone]

## 2022-05-08 NOTE — Discharge Instructions (Signed)

## 2022-05-08 NOTE — ED Triage Notes (Signed)
Patient arrives ambulatory by POV c/o emesis, chills, sore throat and nasal congestion since yesterday.

## 2022-06-14 ENCOUNTER — Other Ambulatory Visit: Payer: Self-pay

## 2022-06-14 ENCOUNTER — Emergency Department (HOSPITAL_COMMUNITY)
Admission: EM | Admit: 2022-06-14 | Discharge: 2022-06-14 | Disposition: A | Discharge status: Home / Self Care | Attending: Emergency Medicine | Admitting: Emergency Medicine

## 2022-06-14 DIAGNOSIS — E876 Hypokalemia: Secondary | ICD-10-CM | POA: Diagnosis not present

## 2022-06-14 DIAGNOSIS — F1320 Sedative, hypnotic or anxiolytic dependence, uncomplicated: Secondary | ICD-10-CM | POA: Insufficient documentation

## 2022-06-14 DIAGNOSIS — Y903 Blood alcohol level of 60-79 mg/100 ml: Secondary | ICD-10-CM | POA: Insufficient documentation

## 2022-06-14 DIAGNOSIS — R569 Unspecified convulsions: Secondary | ICD-10-CM | POA: Insufficient documentation

## 2022-06-14 LAB — CBC WITH DIFFERENTIAL/PLATELET
Abs Immature Granulocytes: 0.09 10*3/uL — ABNORMAL HIGH (ref 0.00–0.07)
Basophils Absolute: 0.1 10*3/uL (ref 0.0–0.1)
Basophils Relative: 1 %
Eosinophils Absolute: 0.1 10*3/uL (ref 0.0–0.5)
Eosinophils Relative: 1 %
HCT: 29.7 % — ABNORMAL LOW (ref 36.0–46.0)
Hemoglobin: 9 g/dL — ABNORMAL LOW (ref 12.0–15.0)
Immature Granulocytes: 1 %
Lymphocytes Relative: 14 %
Lymphs Abs: 1.5 10*3/uL (ref 0.7–4.0)
MCH: 21.6 pg — ABNORMAL LOW (ref 26.0–34.0)
MCHC: 30.3 g/dL (ref 30.0–36.0)
MCV: 71.2 fL — ABNORMAL LOW (ref 80.0–100.0)
Monocytes Absolute: 0.7 10*3/uL (ref 0.1–1.0)
Monocytes Relative: 6 %
Neutro Abs: 8.7 10*3/uL — ABNORMAL HIGH (ref 1.7–7.7)
Neutrophils Relative %: 77 %
Platelets: 402 10*3/uL — ABNORMAL HIGH (ref 150–400)
RBC: 4.17 MIL/uL (ref 3.87–5.11)
RDW: 16.8 % — ABNORMAL HIGH (ref 11.5–15.5)
WBC: 11.1 10*3/uL — ABNORMAL HIGH (ref 4.0–10.5)
nRBC: 0 % (ref 0.0–0.2)

## 2022-06-14 LAB — COMPREHENSIVE METABOLIC PANEL
ALT: 11 U/L (ref 0–44)
AST: 17 U/L (ref 15–41)
Albumin: 3.2 g/dL — ABNORMAL LOW (ref 3.5–5.0)
Alkaline Phosphatase: 44 U/L (ref 38–126)
Anion gap: 11 (ref 5–15)
BUN: 7 mg/dL (ref 6–20)
CO2: 18 mmol/L — ABNORMAL LOW (ref 22–32)
Calcium: 8.4 mg/dL — ABNORMAL LOW (ref 8.9–10.3)
Chloride: 107 mmol/L (ref 98–111)
Creatinine, Ser: 0.89 mg/dL (ref 0.44–1.00)
GFR, Estimated: >60 mL/min (ref >60)
Glucose, Bld: 91 mg/dL (ref 70–99)
Potassium: 3.4 mmol/L — ABNORMAL LOW (ref 3.5–5.1)
Sodium: 136 mmol/L (ref 135–145)
Total Bilirubin: 0.2 mg/dL — ABNORMAL LOW (ref 0.3–1.2)
Total Protein: 7.2 g/dL (ref 6.5–8.1)

## 2022-06-14 LAB — URINALYSIS, ROUTINE W REFLEX MICROSCOPIC
Bilirubin Urine: NEGATIVE
Glucose, UA: NEGATIVE mg/dL
Ketones, ur: NEGATIVE mg/dL
Leukocytes,Ua: NEGATIVE
Nitrite: NEGATIVE
Protein, ur: NEGATIVE mg/dL
Specific Gravity, Urine: 1.01 (ref 1.005–1.030)
pH: 6 (ref 5.0–8.0)

## 2022-06-14 LAB — I-STAT BETA HCG BLOOD, ED (MC, WL, AP ONLY): I-stat hCG, quantitative: <5 m[IU]/mL (ref <5)

## 2022-06-14 LAB — I-STAT CHEM 8, ED
BUN: 6 mg/dL (ref 6–20)
Calcium, Ion: 1.08 mmol/L — ABNORMAL LOW (ref 1.15–1.40)
Chloride: 108 mmol/L (ref 98–111)
Creatinine, Ser: 0.9 mg/dL (ref 0.44–1.00)
Glucose, Bld: 89 mg/dL (ref 70–99)
HCT: 32 % — ABNORMAL LOW (ref 36.0–46.0)
Hemoglobin: 10.9 g/dL — ABNORMAL LOW (ref 12.0–15.0)
Potassium: 3.6 mmol/L (ref 3.5–5.1)
Sodium: 140 mmol/L (ref 135–145)
TCO2: 18 mmol/L — ABNORMAL LOW (ref 22–32)

## 2022-06-14 LAB — RAPID URINE DRUG SCREEN, HOSP PERFORMED
Amphetamines: NOT DETECTED
Barbiturates: NOT DETECTED
Benzodiazepines: POSITIVE — AB
Cocaine: NOT DETECTED
Opiates: NOT DETECTED
Tetrahydrocannabinol: NOT DETECTED

## 2022-06-14 LAB — ETHANOL: Alcohol, Ethyl (B): 69 mg/dL — ABNORMAL HIGH (ref <10)

## 2022-06-14 LAB — ACETAMINOPHEN LEVEL: Acetaminophen (Tylenol), Serum: <10 ug/mL — ABNORMAL LOW (ref 10–30)

## 2022-06-14 LAB — SALICYLATE LEVEL: Salicylate Lvl: <7 mg/dL — ABNORMAL LOW (ref 7.0–30.0)

## 2022-06-14 MED ORDER — IBUPROFEN 400 MG PO TABS
600.0000 mg | ORAL_TABLET | Freq: Once | ORAL | Status: AC
  Administered 2022-06-14: 600 mg via ORAL
  Filled 2022-06-14: qty 1

## 2022-06-14 MED ORDER — ACETAMINOPHEN 500 MG PO TABS
1000.0000 mg | ORAL_TABLET | Freq: Once | ORAL | Status: AC
  Administered 2022-06-14: 1000 mg via ORAL
  Filled 2022-06-14: qty 2

## 2022-06-14 MED ORDER — SODIUM CHLORIDE 0.9 % IV SOLN: INTRAVENOUS | Status: DC

## 2022-06-14 MED ORDER — LORAZEPAM 2 MG/ML IJ SOLN
INTRAMUSCULAR | Status: AC
  Administered 2022-06-14: 2 mg
  Filled 2022-06-14: qty 1

## 2022-06-14 MED ORDER — SODIUM CHLORIDE 0.9 % IV BOLUS
1000.0000 mL | Freq: Once | INTRAVENOUS | Status: AC
  Administered 2022-06-14: 1000 mL via INTRAVENOUS

## 2022-06-14 NOTE — ED Notes (Signed)
Pt ambulated independently to the restroom.

## 2022-06-14 NOTE — ED Provider Notes (Signed)
Patient was initially evaluated by Dr. Eudelia Bunch and signed out to me at 0700 pending sobriety.  Patient is a 22 year old female with a past medical history of seizure disorder and anxiety presenting to the emergency department with concern for seizure-like activity.  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  She did receive Ativan here and benzos and route by EMS.  Her labs are within normal range and pregnancy test is negative.  On my evaluation, the patient is asleep resting in bed comfortably, she is easily arousable to verbal stimulation though still quickly falling back to sleep.  Patient continues to be sedated from medications she received prior to arrival and will continue to be monitored to sobriety.  Heart rate is now improved to 100s.  O2 is being titrated down.  Clinical Course as of 06/14/22 1033  Sun Jun 14, 2022  0530 I-STAT Chem-8 without significant electrolyte derangements or renal insufficiency. [PC]  0600 CBC with mild leukocytosis.  Mild anemia with stable hemoglobin.  Metabolic panel with no significant electrolyte derangement.  No renal insufficiency.  Beta-hCG negative EtOH 69 [PC]  0652 Patient is sleeping. Awakes to verbal stimuli but falls asleep quickly. Likely sedated from versed and ativan from earlier. Will continue to monitor and hydrate.  [PC]  1012 Upon reassessment, patient is awake on room air and ambulatory to the bathroom.  [VK]    Clinical Course User Index [PC] Cardama, Amadeo Garnet, MD [VK] Rexford Maus, DO      Rexford Maus, [place] 06/14/22 [phone]

## 2022-06-14 NOTE — ED Triage Notes (Signed)
Patient arrived with EMS from a local night club , multiple grand mal seizures this morning , CBG= 114, received Versed 5 mg IV by EMS prior to arrival , Ativan 2 mg IV given at arrival for seizure .

## 2022-06-14 NOTE — ED Notes (Signed)
Mother would like a call from the provider . Mother has concerns about her care.  Contact info [phone]

## 2022-06-14 NOTE — ED Provider Notes (Signed)
[place] EMERGENCY DEPARTMENT AT [facility] Provider Note  CSN: [id number] Arrival date & time: 06/14/22 0444  Chief Complaint(s) Seizures   ED Triage Notes Patient arrived with EMS from a local night club , multiple grand mal seizures this morning , CBG= 114, received Versed 5 mg IV by EMS prior to arrival , Ativan 2 mg IV given at arrival for seizure .   HPI Donna Guzman is a 22 y.o. female brought in by EMS with concern for status epilepticus.  Patient has a reported history of seizures on Keppra.  Per EMS patient's mother reported that the patient has been compliant with her medication.  Patient was brought in after having a seizure at a party.  EMS reported that in route patient had approximately 12 seizures requiring 5 mg of Versed.  They described him as tonic-clonic seizures with foaming at the mouth.  No signs of trauma.  The history is provided by the EMS personnel.    Past Medical History Past Medical History:  Diagnosis Date   Seizures [facility])    Patient Active Problem List   Diagnosis Date Noted   Anxiety 03/10/2021   Alpha thalassemia silent carrier 02/28/2021   Anemia affecting pregnancy in first trimester 02/16/2021   Supervision of low-risk pregnancy 02/10/2021   Ovarian cyst 02/10/2021   Seizure (HCC) 12/17/2020   Suicidal ideation 12/17/2020   Home Medication(s) Prior to Admission medications   Medication Sig Start Date End Date Taking? Authorizing Provider  benzonatate (TESSALON) 100 MG capsule Take 1 capsule (100 mg total) by mouth 3 (three) times daily as needed for cough. 05/08/22   Long, Arlyss Repress, MD  cyclobenzaprine (FLEXERIL) 10 MG tablet Take 1 tablet (10 mg total) by mouth 2 (two) times daily as needed for muscle spasms. 03/01/21   Rolm Bookbinder, CNM  dicyclomine (BENTYL) 20 MG tablet Take 1 tablet (20 mg total) by mouth 3 (three) times daily as needed for spasms. 05/08/22   Long, Arlyss Repress, MD  iron polysaccharides (NIFEREX) 150 MG capsule  Take 1 capsule (150 mg total) by mouth every other day. 02/16/21 03/18/21  Calvert Cantor, CNM  levETIRAcetam (KEPPRA) 500 MG tablet Take 1 tablet (500 mg total) by mouth 2 (two) times daily. 02/14/21   Zadie Rhine, MD  ondansetron (ZOFRAN-ODT) 4 MG disintegrating tablet Take 1 tablet (4 mg total) by mouth every 8 (eight) hours as needed for nausea or vomiting. 05/08/22   Long, Arlyss Repress, MD  potassium chloride SA (KLOR-CON M) 20 MEQ tablet One po bid x 3 days, then one po once a day 02/26/22   Cathren Laine, MD  Prenatal MV & Min w/FA-DHA (PRENATAL GUMMIES PO) Take 1 tablet by mouth daily.    [provider]  Allergies Patient has no known allergies.  Review of Systems Review of Systems As noted in HPI  Physical Exam Vital Signs  I have reviewed the triage vital signs BP (!) 135/95   Pulse (!) 114   Temp 98.9 F (37.2 C)   Resp (!) 24   SpO2 100%   Physical Exam Vitals reviewed.  Constitutional:      General: She is not in acute distress.    Appearance: She is well-developed. She is not diaphoretic.  HENT:     Head: Normocephalic and atraumatic.     Nose: Nose normal.  Eyes:     General: No scleral icterus.       Right eye: No discharge.        Left eye: No discharge.     Conjunctiva/sclera: Conjunctivae normal.     Pupils: Pupils are equal, round, and reactive to light.  Cardiovascular:     Rate and Rhythm: Regular rhythm.     Heart sounds: No murmur heard.    No friction rub. No gallop.  Pulmonary:     Effort: Pulmonary effort is normal. No respiratory distress.     Breath sounds: Normal breath sounds. No stridor. No rales.  Abdominal:     General: There is no distension.     Palpations: Abdomen is soft.     Tenderness: There is no abdominal tenderness.  Musculoskeletal:        General: No tenderness.     Cervical back:  Normal range of motion and neck supple.  Skin:    General: Skin is warm and dry.     Findings: No erythema or rash.  Neurological:     Mental Status: She is unresponsive.     Comments: No response to painful stimuli     ED Results and Treatments Labs (all labs ordered are listed, but only abnormal results are displayed) Labs Reviewed  COMPREHENSIVE METABOLIC PANEL - Abnormal; Notable for the following components:      Result Value   Potassium 3.4 (*)    CO2 18 (*)    Calcium 8.4 (*)    Albumin 3.2 (*)    Total Bilirubin 0.2 (*)    All other components within normal limits  ACETAMINOPHEN LEVEL - Abnormal; Notable for the following components:   Acetaminophen (Tylenol), Serum <10 (*)    All other components within normal limits  ETHANOL - Abnormal; Notable for the following components:   Alcohol, Ethyl (B) 69 (*)    All other components within normal limits  CBC WITH DIFFERENTIAL/PLATELET - Abnormal; Notable for the following components:   WBC 11.1 (*)    Hemoglobin 9.0 (*)    HCT 29.7 (*)    MCV 71.2 (*)    MCH 21.6 (*)    RDW 16.8 (*)    Platelets 402 (*)    Neutro Abs 8.7 (*)    Abs Immature Granulocytes 0.09 (*)    All other components within normal limits  SALICYLATE LEVEL - Abnormal; Notable for the following components:   Salicylate Lvl <7.0 (*)    All other components within normal limits  I-STAT CHEM 8, ED - Abnormal; Notable for the following components:   Calcium, Ion 1.08 (*)    TCO2 18 (*)    Hemoglobin 10.9 (*)    HCT 32.0 (*)    All other components within normal limits  RAPID URINE DRUG SCREEN, HOSP PERFORMED  URINALYSIS, ROUTINE W REFLEX MICROSCOPIC  CBG MONITORING, ED  I-STAT BETA  HCG BLOOD, ED ([facility], [facility], [facility] ONLY)                                                                                                                         EKG  EKG Interpretation  Date/Time:  Sunday Jun 14 2022 05:08:11 EDT Ventricular Rate:  121 PR Interval:  140 QRS  Duration: 73 QT Interval:  295 QTC Calculation: 419 R Axis:   79 Text Interpretation: Sinus tachycardia Confirmed by Drema Pry [id number]) on 06/14/2022 5:30:20 AM       Radiology No results found.  Medications Ordered in ED Medications  sodium chloride 0.9 % bolus 1,000 mL (0 mLs Intravenous Stopped 06/14/22 0532)    And  0.9 %  sodium chloride infusion ( Intravenous New Bag/Given 06/14/22 0536)  LORazepam (ATIVAN) 2 MG/ML injection (2 mg  Given 06/14/22 0459)  sodium chloride 0.9 % bolus 1,000 mL (1,000 mLs Intravenous New Bag/Given 06/14/22 6045)                                                                                                                                     Procedures Procedures  (including critical care time)  Medical Decision Making / ED Course  Click here for ABCD2, HEART and other calculators  Medical Decision Making Amount and/or Complexity of Data Reviewed Labs: ordered.  Risk Prescription drug management.    This patient presents to the ED for: Seizure activity   Key initial findings: Immediately upon arrival patient began having tonic-clonic jerking.  She was given 2 mg of Ativan.  Immediately after this, patient was more responsive to verbal stimuli and following commands appropriately.  She was answering questions about the evening's events. Patient smells of alcohol and did admit to alcohol use this evening.  She denied any other illicit drug use. When friend arrive with mother on the phone, patient began having recurrent seizure event.  We were able to break these with saline flush confrontation to the face.  Additional history obtained: Mother who was on the phone, stating that the patient has been more anxious and depressed recently.  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  Presentations involves an extensive number of treatment options, and is a complaint that carries with  it a high risk of complications and morbidity. The differential diagnosis includes but not limited to:  Pseudoseizures Alcohol intoxication Will rule out any significant electrolyte or metabolic derangements.  Will ensure patient is not pregnant. Low suspicion for actual epileptic seizures  Hospitalization considered: Yes  Initial intervention:  IVF   Work up Interpretation and Management:  Cardiac Monitoring/EKG: Telemetry with sinus tachycardia. EKG with sinus tachycardia.  No dysrhythmias or blocks.  Laboratory Tests ordered listed below with my independent interpretation:    Imaging Studies ordered listed below with my independent interpretation:  Clinical Course as of 06/14/22 0733  Sun Jun 14, 2022  0530 I-STAT Chem-8 without significant electrolyte derangements or renal insufficiency. [PC]  0600 CBC with mild leukocytosis.  Mild anemia with stable hemoglobin.  Metabolic panel with no significant electrolyte derangement.  No renal insufficiency.  Beta-hCG negative EtOH 69 [PC]  0652 Patient is sleeping. Awakes to verbal stimuli but falls asleep quickly. Likely sedated from versed and ativan from earlier. Will continue to monitor and hydrate.  [PC]    Clinical Course User Index [PC] Shonda Mandarino, Amadeo Garnet, MD    Patient care turned over to oncoming provider. Patient case and results discussed in detail; please see their note for further ED managment.     Final Clinical Impression(s) / ED Diagnoses Final diagnoses:  None           This chart was dictated using voice recognition software.  Despite best efforts to proofread,  errors can occur which can change the documentation meaning.    Nira Conn, MD 06/14/22 [phone]

## 2022-06-14 NOTE — ED Notes (Signed)
DC instructions reviewed with pt. PT verbalized understanding. Pt DC °

## 2022-06-14 NOTE — Discharge Instructions (Addendum)
You were seen in the emergency department for your seizure-like activity.  Your labs were normal and showed no obvious cause to have seizures and it is unclear if you had a true seizure today or if it was a nonepileptic seizure.  You can follow-up with your primary doctor and your neurologist to see if you need to be restarted on your seizure medication as well as have your symptoms rechecked.  You should return to the emergency department if you are having back-to-back seizures without returning to your normal self in between, you have prolonged seizure lasting more than 10 to 15 minutes, you injure yourself from a seizure or if you have any other new or concerning symptoms.

## 2022-06-25 ENCOUNTER — Ambulatory Visit (HOSPITAL_COMMUNITY)

## 2022-07-09 ENCOUNTER — Ambulatory Visit
Admission: EM | Admit: 2022-07-09 | Discharge: 2022-07-09 | Disposition: A | Discharge status: Home / Self Care | Attending: Nurse Practitioner | Admitting: Nurse Practitioner

## 2022-07-09 DIAGNOSIS — Z202 Contact with and (suspected) exposure to infections with a predominantly sexual mode of transmission: Secondary | ICD-10-CM | POA: Diagnosis not present

## 2022-07-09 DIAGNOSIS — Z113 Encounter for screening for infections with a predominantly sexual mode of transmission: Secondary | ICD-10-CM | POA: Diagnosis present

## 2022-07-09 DIAGNOSIS — N3000 Acute cystitis without hematuria: Secondary | ICD-10-CM | POA: Insufficient documentation

## 2022-07-09 LAB — POCT URINALYSIS DIP (MANUAL ENTRY)
Blood, UA: NEGATIVE
Glucose, UA: NEGATIVE mg/dL
Ketones, POC UA: NEGATIVE mg/dL
Leukocytes, UA: NEGATIVE
Nitrite, UA: POSITIVE — AB
Protein Ur, POC: NEGATIVE mg/dL
Spec Grav, UA: >=1.03 — AB (ref 1.010–1.025)
Urobilinogen, UA: 1 E.U./dL
pH, UA: 6 (ref 5.0–8.0)

## 2022-07-09 LAB — POCT URINE PREGNANCY: Preg Test, Ur: NEGATIVE

## 2022-07-09 MED ORDER — DOXYCYCLINE HYCLATE 100 MG PO CAPS
100.0000 mg | ORAL_CAPSULE | Freq: Two times a day (BID) | ORAL | 0 refills | Status: DC
Start: 2022-07-09 — End: 2022-07-13

## 2022-07-09 NOTE — ED Provider Notes (Signed)
[facility]    CSN: [id number] Arrival date & time: 07/09/22  1840      History   Chief Complaint No chief complaint on file.   HPI Donna Guzman is a 22 y.o. female presents for evaluation of STD testing/exposure.  Patient was informed that she has been exposed to chlamydia.  She currently denies any symptoms including vaginal discharge, fevers, nausea/vomiting, flank pain, dysuria.  She does have some lower abdominal pain which she typically gets when she has a UTI.  No OTC medications have been taken for symptoms.  No other concerns at this time.  HPI  Past Medical History:  Diagnosis Date   Seizures [facility])     Patient Active Problem List   Diagnosis Date Noted   Anxiety 03/10/2021   Alpha thalassemia silent carrier 02/28/2021   Anemia affecting pregnancy in first trimester 02/16/2021   Supervision of low-risk pregnancy 02/10/2021   Ovarian cyst 02/10/2021   Seizure (HCC) 12/17/2020   Suicidal ideation 12/17/2020    Past Surgical History:  Procedure Laterality Date   LACERATION REPAIR     forehead    OB History     Gravida  2   Para  0   Term  0   Preterm  0   AB  1   Living  0      SAB  0   IAB  1   Ectopic  0   Multiple  0   Live Births  0            Home Medications    Prior to Admission medications   Medication Sig Start Date End Date Taking? Authorizing Provider  doxycycline (VIBRAMYCIN) 100 MG capsule Take 1 capsule (100 mg total) by mouth 2 (two) times daily for 7 days. 07/09/22 07/16/22 Yes Radford Pax, NP  benzonatate (TESSALON) 100 MG capsule Take 1 capsule (100 mg total) by mouth 3 (three) times daily as needed for cough. 05/08/22   Long, Arlyss Repress, MD  cyclobenzaprine (FLEXERIL) 10 MG tablet Take 1 tablet (10 mg total) by mouth 2 (two) times daily as needed for muscle spasms. 03/01/21   Rolm Bookbinder, CNM  dicyclomine (BENTYL) 20 MG tablet Take 1 tablet (20 mg total) by mouth 3 (three) times daily as needed for  spasms. 05/08/22   Long, Arlyss Repress, MD  iron polysaccharides (NIFEREX) 150 MG capsule Take 1 capsule (150 mg total) by mouth every other day. 02/16/21 03/18/21  Calvert Cantor, CNM  levETIRAcetam (KEPPRA) 500 MG tablet Take 1 tablet (500 mg total) by mouth 2 (two) times daily. 02/14/21   Zadie Rhine, MD  ondansetron (ZOFRAN-ODT) 4 MG disintegrating tablet Take 1 tablet (4 mg total) by mouth every 8 (eight) hours as needed for nausea or vomiting. 05/08/22   Long, Arlyss Repress, MD  potassium chloride SA (KLOR-CON M) 20 MEQ tablet One po bid x 3 days, then one po once a day 02/26/22   Cathren Laine, MD  Prenatal MV & Min w/FA-DHA (PRENATAL GUMMIES PO) Take 1 tablet by mouth daily.    [provider]    Family History Family History  Problem Relation Age of Onset   Diabetes Mother    Hypertension Mother    Hypertension Father    ADD / ADHD Sister     Social History Social History   Tobacco Use   Smoking status: Former    Types: Cigars   Smokeless tobacco: Former  Advertising account planner  Vaping Use: Former  Substance Use Topics   Alcohol use: Yes    Comment: yesterday   Drug use: Not Currently    Types: Marijuana    Comment: last use was at beginning of pregancy     Allergies   Patient has no known allergies.   Review of Systems Review of Systems  Genitourinary:        STD exposure     Physical Exam Triage Vital Signs ED Triage Vitals  Enc Vitals Group     BP 07/09/22 1851 112/72     Pulse Rate 07/09/22 1851 85     Resp 07/09/22 1851 16     Temp 07/09/22 1851 98.2 F (36.8 C)     Temp Source 07/09/22 1851 Oral     SpO2 07/09/22 1851 97 %     Weight --      Height --      Head Circumference --      Peak Flow --      Pain Score 07/09/22 1855 0     Pain Loc --      Pain Edu? --      Excl. in GC? --    No data found.  Updated Vital Signs BP 112/72 (BP Location: Right Arm)   Pulse 85   Temp 98.2 F (36.8 C) (Oral)   Resp 16   LMP 06/17/2022 (Approximate)    SpO2 97%   Visual Acuity Right Eye Distance:   Left Eye Distance:   Bilateral Distance:    Right Eye Near:   Left Eye Near:    Bilateral Near:     Physical Exam Vitals and nursing note reviewed.  Constitutional:      Appearance: Normal appearance.  HENT:     Head: Normocephalic and atraumatic.  Eyes:     Pupils: Pupils are equal, round, and reactive to light.  Cardiovascular:     Rate and Rhythm: Normal rate.  Pulmonary:     Effort: Pulmonary effort is normal.  Abdominal:     Tenderness: There is no right CVA tenderness or left CVA tenderness.  Skin:    General: Skin is warm and dry.  Neurological:     General: No focal deficit present.     Mental Status: She is alert and oriented to person, place, and time.  Psychiatric:        Mood and Affect: Mood normal.        Behavior: Behavior normal.      UC Treatments / Results  Labs (all labs ordered are listed, but only abnormal results are displayed) Labs Reviewed  POCT URINALYSIS DIP (MANUAL ENTRY) - Abnormal; Notable for the following components:      Result Value   Color, UA orange (*)    Clarity, UA cloudy (*)    Bilirubin, UA small (*)    Spec Grav, UA >=1.030 (*)    Nitrite, UA Positive (*)    All other components within normal limits  URINE CULTURE  RPR  HIV ANTIBODY (ROUTINE TESTING W REFLEX)  POCT URINE PREGNANCY  CERVICOVAGINAL ANCILLARY ONLY    EKG   Radiology No results found.  Procedures Procedures (including critical care time)  Medications Ordered in UC Medications - No data to display  Initial Impression / Assessment and Plan / UC Course  I have reviewed the triage vital signs and the nursing notes.  Pertinent labs & imaging results that were available during my care of the patient were reviewed by  me and considered in my medical decision making (see chart for details).     Reviewed exam and symptoms with patient.  UA positive for UTI, will culture.  Given known exposure to  chlamydia we will start doxycycline which will also cover UTIs STD testing is ordered and will contact for any positive results PCP follow-up as needed ER precautions reviewed Final Clinical Impressions(s) / UC Diagnoses   Final diagnoses:  Exposure to chlamydia  Screening examination for STD (sexually transmitted disease)  Acute cystitis without hematuria     Discharge Instructions      The clinic will contact you with results of the testing done today. Start doxycycline twice daily for 7 days for UTI and exposure to chlamydia Follow-up with your PCP if your symptoms do not improve Please go to the emergency room for any worsening symptoms     ED Prescriptions     Medication Sig Dispense Auth. Provider   doxycycline (VIBRAMYCIN) 100 MG capsule Take 1 capsule (100 mg total) by mouth 2 (two) times daily for 7 days. 14 capsule Radford Pax, NP      PDMP not reviewed this encounter.   Radford Pax, NP 07/09/22 1910

## 2022-07-09 NOTE — Discharge Instructions (Signed)
The clinic will contact you with results of the testing done today. Start doxycycline twice daily for 7 days for UTI and exposure to chlamydia Follow-up with your PCP if your symptoms do not improve Please go to the emergency room for any worsening symptoms
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
## 2022-07-10 ENCOUNTER — Ambulatory Visit

## 2022-07-10 LAB — URINE CULTURE

## 2022-07-10 LAB — CERVICOVAGINAL ANCILLARY ONLY
Bacterial Vaginitis (gardnerella): POSITIVE — AB
Candida Glabrata: NEGATIVE
Candida Vaginitis: POSITIVE — AB
Chlamydia: POSITIVE — AB
Comment: NEGATIVE
Comment: NEGATIVE
Comment: NEGATIVE
Comment: NEGATIVE
Comment: NEGATIVE
Comment: NORMAL
Neisseria Gonorrhea: NEGATIVE
Trichomonas: POSITIVE — AB

## 2022-07-11 ENCOUNTER — Telehealth: Payer: Self-pay | Admitting: Urgent Care

## 2022-07-11 ENCOUNTER — Ambulatory Visit: Admission: EM | Admit: 2022-07-11 | Discharge: 2022-07-11 | Disposition: A | Discharge status: Home / Self Care

## 2022-07-11 LAB — HIV ANTIBODY (ROUTINE TESTING W REFLEX): HIV Screen 4th Generation wRfx: NONREACTIVE

## 2022-07-11 LAB — RPR: RPR Ser Ql: NONREACTIVE

## 2022-07-11 MED ORDER — FLUCONAZOLE 150 MG PO TABS: 150.0000 mg | ORAL_TABLET | ORAL | 0 refills | Status: DC

## 2022-07-11 MED ORDER — METRONIDAZOLE 500 MG PO TABS: 500.0000 mg | ORAL_TABLET | Freq: Two times a day (BID) | ORAL | 0 refills | Status: DC

## 2022-07-11 NOTE — Telephone Encounter (Signed)
Patient needs treatment with Flagyl and Diflucan for BV, trichomonas and yeast vaginitis, respectively.

## 2022-07-11 NOTE — ED Notes (Signed)
Pt do not need to be seeing. Flagyl and fluconazole set to [facility]

## 2022-07-13 ENCOUNTER — Ambulatory Visit
Admission: RE | Admit: 2022-07-13 | Discharge: 2022-07-13 | Disposition: A | Discharge status: Home / Self Care | Source: Ambulatory Visit | Attending: Emergency Medicine | Admitting: Emergency Medicine

## 2022-07-13 ENCOUNTER — Other Ambulatory Visit: Payer: Self-pay

## 2022-07-13 DIAGNOSIS — Z202 Contact with and (suspected) exposure to infections with a predominantly sexual mode of transmission: Secondary | ICD-10-CM | POA: Diagnosis not present

## 2022-07-13 DIAGNOSIS — N3000 Acute cystitis without hematuria: Secondary | ICD-10-CM | POA: Diagnosis not present

## 2022-07-13 MED ORDER — METRONIDAZOLE 500 MG PO TABS: 500.0000 mg | ORAL_TABLET | Freq: Two times a day (BID) | ORAL | 0 refills | Status: DC

## 2022-07-13 MED ORDER — DOXYCYCLINE HYCLATE 100 MG PO CAPS
100.0000 mg | ORAL_CAPSULE | Freq: Two times a day (BID) | ORAL | 0 refills | Status: AC
Start: 2022-07-13 — End: 2022-07-20

## 2022-07-13 NOTE — Discharge Instructions (Signed)
Make sure to take the other pink pill a week after you took the first one  Finish all of the medication prescribed today - both antibiotics - until they are gone. Sorry they taste so horrible. :(  Condoms work great to prevent sexually transmitted infections!

## 2022-07-13 NOTE — ED Provider Notes (Signed)
[facility]    CSN: [id number] Arrival date & time: 07/13/22  1222      History   Chief Complaint Chief Complaint  Patient presents with   SEXUALLY TRANSMITTED DISEASE    Stole pills need more - Entered by patient    HPI Makailynn Hurlock is a 22 y.o. female. She reports being seen and given treatment for STIs last week. She took about 2 days of medicine she thinks and then lost them when she moved so was not able to finish them. Initially came last week to be seen for abd pain - no longer has any pain. Denies vaginal discharge. Does still have rx for diflucan from 6/8 and can finish that rx. Has communicated clearly with her partners about her test results  HPI  Past Medical History:  Diagnosis Date   Seizures [facility])     Patient Active Problem List   Diagnosis Date Noted   Anxiety 03/10/2021   Alpha thalassemia silent carrier 02/28/2021   Anemia affecting pregnancy in first trimester 02/16/2021   Supervision of low-risk pregnancy 02/10/2021   Ovarian cyst 02/10/2021   Seizure (HCC) 12/17/2020   Suicidal ideation 12/17/2020    Past Surgical History:  Procedure Laterality Date   LACERATION REPAIR     forehead    OB History     Gravida  2   Para  0   Term  0   Preterm  0   AB  1   Living  0      SAB  0   IAB  1   Ectopic  0   Multiple  0   Live Births  0            Home Medications    Prior to Admission medications   Medication Sig Start Date End Date Taking? Authorizing Provider  benzonatate (TESSALON) 100 MG capsule Take 1 capsule (100 mg total) by mouth 3 (three) times daily as needed for cough. 05/08/22   Long, Arlyss Repress, MD  cyclobenzaprine (FLEXERIL) 10 MG tablet Take 1 tablet (10 mg total) by mouth 2 (two) times daily as needed for muscle spasms. 03/01/21   Rolm Bookbinder, CNM  dicyclomine (BENTYL) 20 MG tablet Take 1 tablet (20 mg total) by mouth 3 (three) times daily as needed for spasms. 05/08/22   Long, Arlyss Repress, MD   doxycycline (VIBRAMYCIN) 100 MG capsule Take 1 capsule (100 mg total) by mouth 2 (two) times daily for 7 days. 07/13/22 07/20/22  Cathlyn Parsons, NP  fluconazole (DIFLUCAN) 150 MG tablet Take 1 tablet (150 mg total) by mouth once a week. 07/11/22   Wallis Bamberg, PA-C  iron polysaccharides (NIFEREX) 150 MG capsule Take 1 capsule (150 mg total) by mouth every other day. 02/16/21 03/18/21  Calvert Cantor, CNM  levETIRAcetam (KEPPRA) 500 MG tablet Take 1 tablet (500 mg total) by mouth 2 (two) times daily. 02/14/21   Zadie Rhine, MD  metroNIDAZOLE (FLAGYL) 500 MG tablet Take 1 tablet (500 mg total) by mouth 2 (two) times daily with a meal. DO NOT CONSUME ALCOHOL WHILE TAKING THIS MEDICATION. 07/13/22   Cathlyn Parsons, NP  ondansetron (ZOFRAN-ODT) 4 MG disintegrating tablet Take 1 tablet (4 mg total) by mouth every 8 (eight) hours as needed for nausea or vomiting. 05/08/22   Long, Arlyss Repress, MD  potassium chloride SA (KLOR-CON M) 20 MEQ tablet One po bid x 3 days, then one po once a day 02/26/22   Steinl,  Caryn Bee, MD  Prenatal MV & Min w/FA-DHA (PRENATAL GUMMIES PO) Take 1 tablet by mouth daily.    [provider]    Family History Family History  Problem Relation Age of Onset   Diabetes Mother    Hypertension Mother    Hypertension Father    ADD / ADHD Sister     Social History Social History   Tobacco Use   Smoking status: Former    Types: Cigars   Smokeless tobacco: Former  Building services engineer Use: Former  Substance Use Topics   Alcohol use: Yes    Comment: yesterday   Drug use: Not Currently    Types: Marijuana    Comment: last use was at beginning of pregancy     Allergies   Patient has no known allergies.   Review of Systems Review of Systems   Physical Exam Triage Vital Signs ED Triage Vitals  Enc Vitals Group     BP 07/13/22 1301 121/87     Pulse Rate 07/13/22 1300 68     Resp 07/13/22 1300 16     Temp 07/13/22 1300 98.1 F (36.7 C)     Temp Source  07/13/22 1300 Oral     SpO2 07/13/22 1300 99 %     Weight --      Height --      Head Circumference --      Peak Flow --      Pain Score 07/13/22 1259 0     Pain Loc --      Pain Edu? --      Excl. in GC? --    No data found.  Updated Vital Signs BP 121/87   Pulse 68   Temp 98.1 F (36.7 C) (Oral)   Resp 16   LMP 06/17/2022 (Approximate)   SpO2 99%   Visual Acuity Right Eye Distance:   Left Eye Distance:   Bilateral Distance:    Right Eye Near:   Left Eye Near:    Bilateral Near:     Physical Exam Constitutional:      Appearance: Normal appearance.  Pulmonary:     Effort: Pulmonary effort is normal.  Neurological:     Mental Status: She is alert and oriented to person, place, and time.      UC Treatments / Results  Labs (all labs ordered are listed, but only abnormal results are displayed) Labs Reviewed - No data to display  EKG   Radiology No results found.  Procedures Procedures (including critical care time)  Medications Ordered in UC Medications - No data to display  Initial Impression / Assessment and Plan / UC Course  I have reviewed the triage vital signs and the nursing notes.  Pertinent labs & imaging results that were available during my care of the patient were reviewed by me and considered in my medical decision making (see chart for details).    Had a long discussion about STI risk and condom use. Pt understands to finish all medicine as prescribed.   Final Clinical Impressions(s) / UC Diagnoses   Final diagnoses:  Exposure to chlamydia  Acute cystitis without hematuria     Discharge Instructions      Make sure to take the other pink pill a week after you took the first one  Finish all of the medication prescribed today - both antibiotics - until they are gone. Sorry they taste so horrible. :(  Condoms work great to prevent sexually transmitted infections!  ED Prescriptions     Medication Sig Dispense Auth. Provider    doxycycline (VIBRAMYCIN) 100 MG capsule Take 1 capsule (100 mg total) by mouth 2 (two) times daily for 7 days. 14 capsule Cathlyn Parsons, NP   metroNIDAZOLE (FLAGYL) 500 MG tablet Take 1 tablet (500 mg total) by mouth 2 (two) times daily with a meal. DO NOT CONSUME ALCOHOL WHILE TAKING THIS MEDICATION. 14 tablet Cathlyn Parsons, NP      PDMP not reviewed this encounter.   Cathlyn Parsons, NP 07/13/22 1341

## 2022-07-13 NOTE — ED Triage Notes (Signed)
Pt presents for a refill on doxycycline and flagyl. States she lost her pills in the middle of moving and needs more.

## 2022-07-25 ENCOUNTER — Emergency Department (HOSPITAL_COMMUNITY)

## 2022-07-25 ENCOUNTER — Emergency Department (HOSPITAL_COMMUNITY)
Admission: EM | Admit: 2022-07-25 | Discharge: 2022-07-25 | Disposition: A | Discharge status: Home / Self Care | Attending: Emergency Medicine | Admitting: Emergency Medicine

## 2022-07-25 DIAGNOSIS — R569 Unspecified convulsions: Secondary | ICD-10-CM | POA: Diagnosis present

## 2022-07-25 LAB — RAPID URINE DRUG SCREEN, HOSP PERFORMED
Amphetamines: NOT DETECTED
Barbiturates: NOT DETECTED
Benzodiazepines: POSITIVE — AB
Cocaine: NOT DETECTED
Opiates: NOT DETECTED
Tetrahydrocannabinol: NOT DETECTED

## 2022-07-25 LAB — CBC WITH DIFFERENTIAL/PLATELET
Abs Immature Granulocytes: 0.07 10*3/uL (ref 0.00–0.07)
Basophils Absolute: 0.1 10*3/uL (ref 0.0–0.1)
Basophils Relative: 1 %
Eosinophils Absolute: 0.1 10*3/uL (ref 0.0–0.5)
Eosinophils Relative: 1 %
HCT: 33.6 % — ABNORMAL LOW (ref 36.0–46.0)
Hemoglobin: 10.2 g/dL — ABNORMAL LOW (ref 12.0–15.0)
Immature Granulocytes: 1 %
Lymphocytes Relative: 33 %
Lymphs Abs: 3.5 10*3/uL (ref 0.7–4.0)
MCH: 21.2 pg — ABNORMAL LOW (ref 26.0–34.0)
MCHC: 30.4 g/dL (ref 30.0–36.0)
MCV: 69.7 fL — ABNORMAL LOW (ref 80.0–100.0)
Monocytes Absolute: 0.7 10*3/uL (ref 0.1–1.0)
Monocytes Relative: 7 %
Neutro Abs: 6.1 10*3/uL (ref 1.7–7.7)
Neutrophils Relative %: 57 %
Platelets: 274 10*3/uL (ref 150–400)
RBC: 4.82 MIL/uL (ref 3.87–5.11)
RDW: 16.1 % — ABNORMAL HIGH (ref 11.5–15.5)
WBC: 10.5 10*3/uL (ref 4.0–10.5)
nRBC: 0 % (ref 0.0–0.2)

## 2022-07-25 LAB — COMPREHENSIVE METABOLIC PANEL
ALT: 10 U/L (ref 0–44)
AST: 17 U/L (ref 15–41)
Albumin: 3.3 g/dL — ABNORMAL LOW (ref 3.5–5.0)
Alkaline Phosphatase: 41 U/L (ref 38–126)
Anion gap: 12 (ref 5–15)
BUN: 13 mg/dL (ref 6–20)
CO2: 18 mmol/L — ABNORMAL LOW (ref 22–32)
Calcium: 9 mg/dL (ref 8.9–10.3)
Chloride: 109 mmol/L (ref 98–111)
Creatinine, Ser: 1.3 mg/dL — ABNORMAL HIGH (ref 0.44–1.00)
GFR, Estimated: 59 mL/min — ABNORMAL LOW (ref >60)
Glucose, Bld: 90 mg/dL (ref 70–99)
Potassium: 3 mmol/L — ABNORMAL LOW (ref 3.5–5.1)
Sodium: 139 mmol/L (ref 135–145)
Total Bilirubin: 0.5 mg/dL (ref 0.3–1.2)
Total Protein: 6.7 g/dL (ref 6.5–8.1)

## 2022-07-25 LAB — I-STAT BETA HCG BLOOD, ED (MC, WL, AP ONLY): I-stat hCG, quantitative: <5 m[IU]/mL (ref <5)

## 2022-07-25 LAB — CBG MONITORING, ED: Glucose-Capillary: 78 mg/dL (ref 70–99)

## 2022-07-25 LAB — ETHANOL: Alcohol, Ethyl (B): 117 mg/dL — ABNORMAL HIGH (ref <10)

## 2022-07-25 MED ORDER — LEVETIRACETAM 500 MG PO TABS: 500.0000 mg | ORAL_TABLET | Freq: Two times a day (BID) | ORAL | 0 refills | Status: DC

## 2022-07-25 MED ORDER — LEVETIRACETAM IN NACL 1500 MG/100ML IV SOLN
1500.0000 mg | Freq: Once | INTRAVENOUS | Status: AC
  Administered 2022-07-25: 1500 mg via INTRAVENOUS
  Filled 2022-07-25: qty 100

## 2022-07-25 MED ORDER — THIAMINE HCL 100 MG/ML IJ SOLN: 100.0000 mg | Freq: Once | INTRAMUSCULAR | Status: DC

## 2022-07-25 MED ORDER — POTASSIUM CHLORIDE 10 MEQ/100ML IV SOLN
10.0000 meq | INTRAVENOUS | Status: AC
  Administered 2022-07-25 (×2): 10 meq via INTRAVENOUS
  Filled 2022-07-25 (×2): qty 100

## 2022-07-25 MED ORDER — THIAMINE HCL 100 MG PO TABS
100.0000 mg | ORAL_TABLET | Freq: Once | ORAL | Status: DC
  Filled 2022-07-25 (×2): qty 1

## 2022-07-25 MED ORDER — FOLIC ACID 1 MG PO TABS
1.0000 mg | ORAL_TABLET | Freq: Once | ORAL | Status: DC
  Filled 2022-07-25: qty 1

## 2022-07-25 MED ORDER — SODIUM CHLORIDE 0.9 % IV BOLUS
1000.0000 mL | Freq: Once | INTRAVENOUS | Status: AC
  Administered 2022-07-25: 1000 mL via INTRAVENOUS

## 2022-07-25 MED ORDER — ADULT MULTIVITAMIN W/MINERALS CH
1.0000 | ORAL_TABLET | Freq: Once | ORAL | Status: DC
  Filled 2022-07-25: qty 1
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
## 2022-07-25 NOTE — ED Notes (Signed)
Patient verbalizes understanding of discharge instructions. Opportunity for questioning and answers were provided. Pt discharged from ED. 

## 2022-07-25 NOTE — ED Notes (Signed)
[phone] [name] patient mother. Call with any questions about medical history
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
## 2022-07-25 NOTE — ED Notes (Signed)
Patient noted to have 2 "seizure-like" activity post arrival to ED, foaming at the mouth. Provider at bedside aware. See orders. Pt initially unresponsive to painful stimuli. Remains on the cardiac monitor. Noted tachypnic on the monitor.

## 2022-07-25 NOTE — ED Notes (Signed)
Pt alert to voice; refusing meds; encouraged pt to ambulate; pt still lethargic, uncooperative at this time; EDP Sharyl Nimrod notified

## 2022-07-25 NOTE — ED Notes (Signed)
Pt speaking on phone with family 

## 2022-07-25 NOTE — ED Notes (Signed)
Pt alert, eating.  

## 2022-07-25 NOTE — ED Notes (Signed)
Pt A and O x 4, ambulatory with steady gait

## 2022-07-25 NOTE — ED Notes (Signed)
Patient noted to be arousing to verbal stimuli at this time but not yet talking. Opening eyes. Respirations regular and unlabored. Remains on the cardiac monitor. Seizure pads remain in place at this time.

## 2022-07-25 NOTE — ED Notes (Signed)
Pt resting with eyes closed; respirations spontaneous, even, unlabored 
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
## 2022-07-25 NOTE — ED Notes (Signed)
In and out cathed patient for urine drug screen. Output 200 cc.

## 2022-07-25 NOTE — Discharge Instructions (Addendum)
It was a pleasure caring for you today. Your potassium was a little low - so you were given potassium supplementation in ED. We are also discharging you with a new prescription for Keppra - please take as directed and follow up with your Neurologist.  Seek emergency care if experiencing and new or worsening symptoms.

## 2022-07-25 NOTE — ED Notes (Signed)
Pt stood at side of bed but was unable to ambulate due to complaints of "feeling weak"; EDP Meredith notified; pt oriented to self, place, and time

## 2022-07-30 ENCOUNTER — Ambulatory Visit
Admission: EM | Admit: 2022-07-30 | Discharge: 2022-07-30 | Disposition: A | Discharge status: Home / Self Care | Attending: Urgent Care | Admitting: Urgent Care

## 2022-07-30 DIAGNOSIS — Z113 Encounter for screening for infections with a predominantly sexual mode of transmission: Secondary | ICD-10-CM | POA: Insufficient documentation

## 2022-07-30 NOTE — Discharge Instructions (Signed)
The most reliable time frame to retest for infections like this is 4 weeks after completing treatment. That date for you would be 08/17/2022. If you test positive still I do not recommend taking more antibiotics especially if you do have symptoms of vaginal discharge, vaginal bleeding, pelvic pain, pain when you urinate. I would recommend coming back on 08/17/2022 for retesting then.

## 2022-07-30 NOTE — ED Provider Notes (Signed)
[facility]  Note:  This document was prepared using Conservation officer, historic buildings and may include unintentional dictation errors.  MRN: [id number] DOB: 2000/08/16  Subjective:   Donna Guzman is a 22 y.o. female presenting for retesting for STIs, BV and yeast infection.  Patient was seen June 6th and 10th.  She tested positive for chlamydia, trichomonas, bacterial vaginosis and yeast vaginitis from the initial visit.  She returned on the 10th because she had lost her medication.  This was restarted from that visit.  Reports that she did complete the medication roughly about 1 week ago.  Denies fever, n/v, abdominal pain, pelvic pain, rashes, dysuria, urinary frequency, hematuria, vaginal discharge.    No current facility-administered medications for this encounter.  Current Outpatient Medications:    benzonatate (TESSALON) 100 MG capsule, Take 1 capsule (100 mg total) by mouth 3 (three) times daily as needed for cough., Disp: 21 capsule, Rfl: 0   cyclobenzaprine (FLEXERIL) 10 MG tablet, Take 1 tablet (10 mg total) by mouth 2 (two) times daily as needed for muscle spasms., Disp: 20 tablet, Rfl: 0   dicyclomine (BENTYL) 20 MG tablet, Take 1 tablet (20 mg total) by mouth 3 (three) times daily as needed for spasms., Disp: 20 tablet, Rfl: 0   fluconazole (DIFLUCAN) 150 MG tablet, Take 1 tablet (150 mg total) by mouth once a week., Disp: 2 tablet, Rfl: 0   iron polysaccharides (NIFEREX) 150 MG capsule, Take 1 capsule (150 mg total) by mouth every other day., Disp: 15 capsule, Rfl: 0   levETIRAcetam (KEPPRA) 500 MG tablet, Take 1 tablet (500 mg total) by mouth 2 (two) times daily., Disp: 60 tablet, Rfl: 0   metroNIDAZOLE (FLAGYL) 500 MG tablet, Take 1 tablet (500 mg total) by mouth 2 (two) times daily with a meal. DO NOT CONSUME ALCOHOL WHILE TAKING THIS MEDICATION., Disp: 14 tablet, Rfl: 0   ondansetron (ZOFRAN-ODT) 4 MG disintegrating tablet, Take 1 tablet (4 mg total) by  mouth every 8 (eight) hours as needed for nausea or vomiting., Disp: 20 tablet, Rfl: 0   potassium chloride SA (KLOR-CON M) 20 MEQ tablet, One po bid x 3 days, then one po once a day, Disp: 20 tablet, Rfl: 0   Prenatal MV & Min w/FA-DHA (PRENATAL GUMMIES PO), Take 1 tablet by mouth daily., Disp: , Rfl:    No Known Allergies  Past Medical History:  Diagnosis Date   Seizures (HCC)      Past Surgical History:  Procedure Laterality Date   LACERATION REPAIR     forehead    Family History  Problem Relation Age of Onset   Diabetes Mother    Hypertension Mother    Hypertension Father    ADD / ADHD Sister     Social History   Tobacco Use   Smoking status: Former    Types: Cigars   Smokeless tobacco: Never  Vaping Use   Vaping Use: Former  Substance Use Topics   Alcohol use: Yes    Comment: daily   Drug use: Not Currently    ROS   Objective:   Vitals: BP 118/80 (BP Location: Right Arm)   Pulse 74   Temp 98.3 F (36.8 C) (Oral)   Resp 20   LMP 07/23/2022 (Approximate)   SpO2 96%   Physical Exam Constitutional:      General: She is not in acute distress.    Appearance: Normal appearance. She is well-developed. She is not ill-appearing, toxic-appearing or  diaphoretic.  HENT:     Head: Normocephalic and atraumatic.     Nose: Nose normal.     Mouth/Throat:     Mouth: Mucous membranes are moist.  Eyes:     General: No scleral icterus.       Right eye: No discharge.        Left eye: No discharge.     Extraocular Movements: Extraocular movements intact.  Cardiovascular:     Rate and Rhythm: Normal rate.  Pulmonary:     Effort: Pulmonary effort is normal.  Skin:    General: Skin is warm and dry.  Neurological:     General: No focal deficit present.     Mental Status: She is alert and oriented to person, place, and time.  Psychiatric:        Mood and Affect: Mood normal.        Behavior: Behavior normal.     Assessment and Plan :   PDMP not reviewed  this encounter.  1. Screen for STD (sexually transmitted disease)    Discussed appropriate timelines for retesting, patient insists on a retest today.  I advised that we would not be using antibiotics should she continue to test positive from today's visit especially since she is asymptomatic.  Patient verbalized understanding.  I provided her with a reliable timeframe for which she could retest.  Plans on returning then as well.   Wallis Bamberg, PA-C 07/30/22 1434

## 2022-07-30 NOTE — ED Triage Notes (Signed)
Pt requesting STD testing/recheck-NAD-steady gait

## 2022-07-31 ENCOUNTER — Telehealth: Payer: Self-pay | Admitting: Emergency Medicine

## 2022-07-31 LAB — CERVICOVAGINAL ANCILLARY ONLY
Bacterial Vaginitis (gardnerella): POSITIVE — AB
Candida Glabrata: NEGATIVE
Candida Vaginitis: NEGATIVE
Chlamydia: NEGATIVE
Comment: NEGATIVE
Comment: NEGATIVE
Comment: NEGATIVE
Comment: NEGATIVE
Comment: NEGATIVE
Comment: NORMAL
Neisseria Gonorrhea: NEGATIVE
Trichomonas: NEGATIVE

## 2022-07-31 MED ORDER — METRONIDAZOLE 500 MG PO TABS: 500.0000 mg | ORAL_TABLET | Freq: Two times a day (BID) | ORAL | 0 refills | Status: AC

## 2022-09-25 ENCOUNTER — Ambulatory Visit
Admission: EM | Admit: 2022-09-25 | Discharge: 2022-09-25 | Disposition: A | Discharge status: Home / Self Care | Payer: No Typology Code available for payment source | Attending: Internal Medicine | Admitting: Internal Medicine

## 2022-09-25 DIAGNOSIS — Z113 Encounter for screening for infections with a predominantly sexual mode of transmission: Secondary | ICD-10-CM | POA: Diagnosis not present

## 2022-09-25 NOTE — ED Provider Notes (Signed)
[facility]    CSN: [id number] Arrival date & time: 09/25/22  1330      History   Chief Complaint Chief Complaint  Patient presents with   SEXUALLY TRANSMITTED DISEASE    HPI Donna Guzman is a 22 y.o. female.   Patient here for STD testing.  Patient reports a few months ago, she tested positive for an STD.  States that about 2 days ago she recently had unprotected sexual intercourse with the same sexual partner so is concerned she could have been exposed to STD again as she is not sure if they were tested and treated previously.  Denies any current symptoms. Last menstrual completed about 4 days ago.      Past Medical History:  Diagnosis Date   Seizures [facility])     Patient Active Problem List   Diagnosis Date Noted   Anxiety 03/10/2021   Alpha thalassemia silent carrier 02/28/2021   Anemia affecting pregnancy in first trimester 02/16/2021   Supervision of low-risk pregnancy 02/10/2021   Ovarian cyst 02/10/2021   Seizure (HCC) 12/17/2020   Suicidal ideation 12/17/2020    Past Surgical History:  Procedure Laterality Date   LACERATION REPAIR     forehead    OB History     Gravida  2   Para  0   Term  0   Preterm  0   AB  1   Living  0      SAB  0   IAB  1   Ectopic  0   Multiple  0   Live Births  0            Home Medications    Prior to Admission medications   Medication Sig Start Date End Date Taking? Authorizing Provider  benzonatate (TESSALON) 100 MG capsule Take 1 capsule (100 mg total) by mouth 3 (three) times daily as needed for cough. 05/08/22   Long, Arlyss Repress, MD  cyclobenzaprine (FLEXERIL) 10 MG tablet Take 1 tablet (10 mg total) by mouth 2 (two) times daily as needed for muscle spasms. 03/01/21   Rolm Bookbinder, CNM  dicyclomine (BENTYL) 20 MG tablet Take 1 tablet (20 mg total) by mouth 3 (three) times daily as needed for spasms. 05/08/22   Long, Arlyss Repress, MD  fluconazole (DIFLUCAN) 150 MG tablet Take 1 tablet (150  mg total) by mouth once a week. 07/11/22   Wallis Bamberg, PA-C  iron polysaccharides (NIFEREX) 150 MG capsule Take 1 capsule (150 mg total) by mouth every other day. 02/16/21 03/18/21  Calvert Cantor, CNM  levETIRAcetam (KEPPRA) 500 MG tablet Take 1 tablet (500 mg total) by mouth 2 (two) times daily. 07/25/22   Gareth Eagle, PA-C  metroNIDAZOLE (FLAGYL) 500 MG tablet Take 1 tablet (500 mg total) by mouth 2 (two) times daily. 07/31/22   Lamptey, Britta Mccreedy, MD  ondansetron (ZOFRAN-ODT) 4 MG disintegrating tablet Take 1 tablet (4 mg total) by mouth every 8 (eight) hours as needed for nausea or vomiting. 05/08/22   Long, Arlyss Repress, MD  potassium chloride SA (KLOR-CON M) 20 MEQ tablet One po bid x 3 days, then one po once a day 02/26/22   Cathren Laine, MD  Prenatal MV & Min w/FA-DHA (PRENATAL GUMMIES PO) Take 1 tablet by mouth daily.    [provider]    Family History Family History  Problem Relation Age of Onset   Diabetes Mother    Hypertension Mother    Hypertension Father  ADD / ADHD Sister     Social History Social History   Tobacco Use   Smoking status: Former    Types: Cigars   Smokeless tobacco: Never  Vaping Use   Vaping status: Former  Substance Use Topics   Alcohol use: Yes    Comment: daily   Drug use: Not Currently     Allergies   Patient has no known allergies.   Review of Systems Review of Systems Per HPI  Physical Exam Triage Vital Signs ED Triage Vitals  Encounter Vitals Group     BP 09/25/22 1437 108/75     Systolic BP Percentile --      Diastolic BP Percentile --      Pulse Rate 09/25/22 1437 75     Resp 09/25/22 1437 16     Temp 09/25/22 1437 98.1 F (36.7 C)     Temp Source 09/25/22 1437 Oral     SpO2 09/25/22 1437 98 %     Weight --      Height --      Head Circumference --      Peak Flow --      Pain Score 09/25/22 1504 0     Pain Loc --      Pain Education --      Exclude from Growth Chart --    No data found.  Updated  Vital Signs BP 108/75 (BP Location: Left Arm)   Pulse 75   Temp 98.1 F (36.7 C) (Oral)   Resp 16   LMP 09/23/2022 (Approximate)   SpO2 98%   Visual Acuity Right Eye Distance:   Left Eye Distance:   Bilateral Distance:    Right Eye Near:   Left Eye Near:    Bilateral Near:     Physical Exam Constitutional:      General: She is not in acute distress.    Appearance: Normal appearance. She is not toxic-appearing or diaphoretic.  HENT:     Head: Normocephalic and atraumatic.  Eyes:     Extraocular Movements: Extraocular movements intact.     Conjunctiva/sclera: Conjunctivae normal.  Pulmonary:     Effort: Pulmonary effort is normal.  Genitourinary:    Comments: Deferred with shared decision making. Self swab performed.  Neurological:     General: No focal deficit present.     Mental Status: She is alert and oriented to person, place, and time. Mental status is at baseline.  Psychiatric:        Mood and Affect: Mood normal.        Behavior: Behavior normal.        Thought Content: Thought content normal.        Judgment: Judgment normal.      UC Treatments / Results  Labs (all labs ordered are listed, but only abnormal results are displayed) Labs Reviewed  RPR  HIV ANTIBODY (ROUTINE TESTING W REFLEX)  CERVICOVAGINAL ANCILLARY ONLY    EKG   Radiology No results found.  Procedures Procedures (including critical care time)  Medications Ordered in UC Medications - No data to display  Initial Impression / Assessment and Plan / UC Course  I have reviewed the triage vital signs and the nursing notes.  Pertinent labs & imaging results that were available during my care of the patient were reviewed by me and considered in my medical decision making (see chart for details).     Cervicovaginal swab, HIV, RPR test pending.  Given no confirmed exposure to STD,  will await results for any treatment, if necessary.  Advised safe sex practices. Patient verbalized  understanding and was agreeable with plan. Final Clinical Impressions(s) / UC Diagnoses   Final diagnoses:  Screening examination for venereal disease     Discharge Instructions      Your STD testing is pending.  Will call if anything is positive and send any appropriate treatment.    ED Prescriptions   None    PDMP not reviewed this encounter.   Gustavus Bryant, [place] 09/25/22 [phone]

## 2022-09-25 NOTE — Discharge Instructions (Addendum)
Your STD testing is pending.  Will call if anything is positive and send any appropriate treatment.

## 2022-09-25 NOTE — ED Triage Notes (Signed)
Pt is requesting STD-testing. Denies recent expose.

## 2022-09-26 LAB — RPR: RPR Ser Ql: NONREACTIVE

## 2022-09-26 LAB — HIV ANTIBODY (ROUTINE TESTING W REFLEX): HIV Screen 4th Generation wRfx: NONREACTIVE

## 2022-09-28 ENCOUNTER — Telehealth: Payer: Self-pay

## 2022-09-28 LAB — CERVICOVAGINAL ANCILLARY ONLY
Bacterial Vaginitis (gardnerella): POSITIVE — AB
Candida Glabrata: NEGATIVE
Candida Vaginitis: NEGATIVE
Chlamydia: NEGATIVE
Comment: NEGATIVE
Comment: NEGATIVE
Comment: NEGATIVE
Comment: NEGATIVE
Comment: NEGATIVE
Comment: NORMAL
Neisseria Gonorrhea: NEGATIVE
Trichomonas: POSITIVE — AB

## 2022-09-28 MED ORDER — METRONIDAZOLE 500 MG PO TABS: 500.0000 mg | ORAL_TABLET | Freq: Two times a day (BID) | ORAL | 0 refills | Status: AC

## 2022-09-28 NOTE — Telephone Encounter (Signed)
 Per protocol, pt requires tx with metronidazole. Attempted to reach patient x1. Unable to LVM.  Rx sent to pharmacy on file.

## 2022-10-12 ENCOUNTER — Encounter (HOSPITAL_COMMUNITY): Payer: Self-pay | Admitting: Emergency Medicine

## 2022-10-12 ENCOUNTER — Other Ambulatory Visit: Payer: Self-pay

## 2022-10-12 ENCOUNTER — Emergency Department (HOSPITAL_COMMUNITY): Payer: Medicaid Other

## 2022-10-12 ENCOUNTER — Emergency Department (HOSPITAL_COMMUNITY)
Admission: EM | Admit: 2022-10-12 | Discharge: 2022-10-12 | Disposition: A | Discharge status: Home / Self Care | Payer: Medicaid Other | Attending: Emergency Medicine | Admitting: Emergency Medicine

## 2022-10-12 DIAGNOSIS — N939 Abnormal uterine and vaginal bleeding, unspecified: Secondary | ICD-10-CM | POA: Insufficient documentation

## 2022-10-12 DIAGNOSIS — R569 Unspecified convulsions: Secondary | ICD-10-CM | POA: Insufficient documentation

## 2022-10-12 LAB — CBC WITH DIFFERENTIAL/PLATELET
Abs Immature Granulocytes: 0.02 10*3/uL (ref 0.00–0.07)
Basophils Absolute: 0.1 10*3/uL (ref 0.0–0.1)
Basophils Relative: 1 %
Eosinophils Absolute: 0.1 10*3/uL (ref 0.0–0.5)
Eosinophils Relative: 2 %
HCT: 36.2 % (ref 36.0–46.0)
Hemoglobin: 10.8 g/dL — ABNORMAL LOW (ref 12.0–15.0)
Immature Granulocytes: 0 %
Lymphocytes Relative: 27 %
Lymphs Abs: 1.7 10*3/uL (ref 0.7–4.0)
MCH: 21.8 pg — ABNORMAL LOW (ref 26.0–34.0)
MCHC: 29.8 g/dL — ABNORMAL LOW (ref 30.0–36.0)
MCV: 73 fL — ABNORMAL LOW (ref 80.0–100.0)
Monocytes Absolute: 0.6 10*3/uL (ref 0.1–1.0)
Monocytes Relative: 9 %
Neutro Abs: 3.8 10*3/uL (ref 1.7–7.7)
Neutrophils Relative %: 61 %
Platelets: 309 10*3/uL (ref 150–400)
RBC: 4.96 MIL/uL (ref 3.87–5.11)
RDW: 14.6 % (ref 11.5–15.5)
WBC: 6.2 10*3/uL (ref 4.0–10.5)
nRBC: 0 % (ref 0.0–0.2)

## 2022-10-12 LAB — HCG, SERUM, QUALITATIVE: Preg, Serum: NEGATIVE

## 2022-10-12 LAB — BASIC METABOLIC PANEL
Anion gap: 7 (ref 5–15)
BUN: 9 mg/dL (ref 6–20)
CO2: 24 mmol/L (ref 22–32)
Calcium: 9 mg/dL (ref 8.9–10.3)
Chloride: 105 mmol/L (ref 98–111)
Creatinine, Ser: 0.66 mg/dL (ref 0.44–1.00)
GFR, Estimated: >60 mL/min (ref >60)
Glucose, Bld: 97 mg/dL (ref 70–99)
Potassium: 3.5 mmol/L (ref 3.5–5.1)
Sodium: 136 mmol/L (ref 135–145)

## 2022-10-12 MED ORDER — LEVETIRACETAM IN NACL 1500 MG/100ML IV SOLN
1500.0000 mg | Freq: Once | INTRAVENOUS | Status: AC
  Administered 2022-10-12: 1500 mg via INTRAVENOUS
  Filled 2022-10-12: qty 100

## 2022-10-12 MED ORDER — KETOROLAC TROMETHAMINE 15 MG/ML IJ SOLN
15.0000 mg | Freq: Once | INTRAMUSCULAR | Status: AC
  Administered 2022-10-12: 15 mg via INTRAVENOUS
  Filled 2022-10-12: qty 1

## 2022-10-12 MED ORDER — LEVETIRACETAM 500 MG PO TABS: 500.0000 mg | ORAL_TABLET | Freq: Two times a day (BID) | ORAL | 0 refills | Status: AC

## 2022-10-12 MED ORDER — LORAZEPAM 2 MG/ML IJ SOLN
INTRAMUSCULAR | Status: AC
  Administered 2022-10-12: 2 mg
  Filled 2022-10-12: qty 1

## 2022-10-12 NOTE — ED Provider Notes (Signed)
[facility] Provider Note   CSN: [id number] Arrival date & time: 10/12/22  1456     History  Chief Complaint  Patient presents with   Seizures    Donna Guzman is a 22 y.o. female.  Patient is a 22 year old female with a history of seizure disorder who has not had Keppra in the last 3 days because her prescription ran out who is presenting today after having a seizure at home.  Patient reports that she had a normal menses that finished earlier last week and then 2 to 3 days ago she started having significant heavy bleeding that she is going through 5 or 6 pads super absorbency in a day because tampons are 91 working.  She has had some lower abdominal cramping associated with this.  She also reports she has not slept well but family found her on the floor today and assume she had had a seizure.  Patient reports that she does not remember anything that happened but she had bit her tongue and been incontinent on herself.  She reports her last seizure was some months ago but usually comes out when her body is under stress and she has not had her medication.  She denies history of heavy vaginal bleeding or bleeding in between cycles.  She has not had any vaginal discharge or urinary complaints.  Since her seizure she has been having pain in her low to mid back area.  She denies any numbness or weakness in arms or legs.  No headache or neck pain.  The history is provided by the patient.  Seizures      Home Medications Prior to Admission medications   Medication Sig Start Date End Date Taking? Authorizing Provider  benzonatate (TESSALON) 100 MG capsule Take 1 capsule (100 mg total) by mouth 3 (three) times daily as needed for cough. 05/08/22   Long, Arlyss Repress, MD  cyclobenzaprine (FLEXERIL) 10 MG tablet Take 1 tablet (10 mg total) by mouth 2 (two) times daily as needed for muscle spasms. 03/01/21   Rolm Bookbinder, CNM  dicyclomine (BENTYL) 20 MG  tablet Take 1 tablet (20 mg total) by mouth 3 (three) times daily as needed for spasms. 05/08/22   Long, Arlyss Repress, MD  fluconazole (DIFLUCAN) 150 MG tablet Take 1 tablet (150 mg total) by mouth once a week. 07/11/22   Wallis Bamberg, PA-C  iron polysaccharides (NIFEREX) 150 MG capsule Take 1 capsule (150 mg total) by mouth every other day. 02/16/21 03/18/21  Calvert Cantor, CNM  levETIRAcetam (KEPPRA) 500 MG tablet Take 1 tablet (500 mg total) by mouth 2 (two) times daily. 07/25/22   Gareth Eagle, PA-C  metroNIDAZOLE (FLAGYL) 500 MG tablet Take 1 tablet (500 mg total) by mouth 2 (two) times daily. 07/31/22   Lamptey, Britta Mccreedy, MD  ondansetron (ZOFRAN-ODT) 4 MG disintegrating tablet Take 1 tablet (4 mg total) by mouth every 8 (eight) hours as needed for nausea or vomiting. 05/08/22   Long, Arlyss Repress, MD  potassium chloride SA (KLOR-CON M) 20 MEQ tablet One po bid x 3 days, then one po once a day 02/26/22   Cathren Laine, MD  Prenatal MV & Min w/FA-DHA (PRENATAL GUMMIES PO) Take 1 tablet by mouth daily.    [provider]      Allergies    Patient has no known allergies.    Review of Systems   Review of Systems  Neurological:  Positive for seizures.  Physical Exam Updated Vital Signs BP 125/87 (BP Location: Left Arm)   Pulse 69   Temp 99.2 F (37.3 C)   Resp 16   LMP 09/23/2022 (Approximate)   SpO2 100%  Physical Exam Vitals and nursing note reviewed. Exam conducted with a chaperone present.  Constitutional:      General: She is not in acute distress.    Appearance: She is well-developed.  HENT:     Head: Normocephalic and atraumatic.  Eyes:     Pupils: Pupils are equal, round, and reactive to light.  Cardiovascular:     Rate and Rhythm: Normal rate and regular rhythm.     Pulses: Normal pulses.     Heart sounds: Normal heart sounds. No murmur heard.    No friction rub.  Pulmonary:     Effort: Pulmonary effort is normal.     Breath sounds: Normal breath sounds. No  wheezing or rales.  Abdominal:     General: Bowel sounds are normal. There is no distension.     Palpations: Abdomen is soft.     Tenderness: There is no abdominal tenderness. There is no guarding or rebound.     Comments: Mild pelvic pain with palpation  Genitourinary:    Vagina: Bleeding present.     Adnexa:        Right: Tenderness present.        Left: Tenderness present.   Musculoskeletal:        General: No tenderness. Normal range of motion.       Back:     Comments: No edema.  Pain to the mid spine in the upper lumbar and lower thoracic region  Skin:    General: Skin is warm and dry.     Findings: No rash.  Neurological:     Mental Status: She is alert and oriented to person, place, and time. Mental status is at baseline.     Cranial Nerves: No cranial nerve deficit.  Psychiatric:        Behavior: Behavior normal.     ED Results / Procedures / Treatments   Labs (all labs ordered are listed, but only abnormal results are displayed) Labs Reviewed  CBC WITH DIFFERENTIAL/PLATELET  BASIC METABOLIC PANEL  HCG, SERUM, QUALITATIVE  GC/CHLAMYDIA PROBE AMP ([place]) NOT AT [facility]    EKG None  Radiology No results found.  Procedures Procedures    Medications Ordered in ED Medications  levETIRAcetam (KEPPRA) IVPB 1500 mg/ 100 mL premix (1,500 mg Intravenous New Bag/Given 10/12/22 1603)    ED Course/ Medical Decision Making/ A&P                                 Medical Decision Making Amount and/or Complexity of Data Reviewed Labs: ordered.  Risk Prescription drug management.   Pt with multiple medical problems and comorbidities and presenting today with a complaint that caries a high risk for morbidity and mortality.  Today after having a seizure but also having heavy vaginal bleeding.  Feel the seizure was related to her having increased stress to her body, poor sleep and not taking her Keppra.  Patient is neurologically intact at this time and not  displaying any postictal symptoms.  She is having back pain since the seizure we will do plain imaging to ensure no thoracic or lumbar fracture.  Also on exam patient does have some pelvic discomfort but is also having adnexal tenderness on  exam bilaterally.  Unclear cause for patient's menometrorrhagia.  Lower suspicion for pregnancy but concern for possible fibroid, hormonal irregularity.  Lab and pelvic ultrasound is pending.  Patient given pain control.         Final Clinical Impression(s) / ED Diagnoses Final diagnoses:  None    Rx / DC Orders ED Discharge Orders     None         Gwyneth Sprout, MD 10/12/22 1707

## 2022-10-12 NOTE — ED Provider Notes (Signed)
  Physical Exam  BP 125/87 (BP Location: Left Arm)   Pulse 69   Temp 99.2 F (37.3 C)   Resp 16   LMP 09/23/2022 (Approximate)   SpO2 100%   Physical Exam  Procedures  Procedures  ED Course / MDM    Medical Decision Making Amount and/or Complexity of Data Reviewed Labs: ordered. Radiology: ordered.  Risk Prescription drug management.   Seizure disorder, has not had keppra for 3 days.   Had recent period then heavy vaginal bleeding.  Pending pelvic US.  Plain films for back pain after seizure.  Getting keppra.  Refill 500mg  BID

## 2022-10-12 NOTE — ED Notes (Signed)
Patient got discharge 10 mins ago. After checking to the room patient still with her gown and dont want to leave. Patient stated I'm gonna wait for my ride here. RN stated patient can wait to the lobby for her ride. Patient start yelling to RN. Patient state " get the fuck out on my room" , Consulting civil engineer informed . Per charge RN if patient wont leave we will call security to escort patient out. Patient informed and remain to be verbally abusive.

## 2022-10-12 NOTE — ED Notes (Signed)
Patient refused to be transfer to [facility]. Patient wants to speak with EDP first. EDP informed. [facility] left. RN will call if patient decided if she wants to be transfer.

## 2022-10-12 NOTE — ED Triage Notes (Signed)
Patient BIB EMS from home c/o seizure. Per report family found patient in the floor on her room seizing. Patient c/o back pain. Patient denies N/V. BP 116/70 HR 89 RR 20 O2sat 99% on RA CBG 119

## 2022-10-15 NOTE — ED Notes (Signed)
10/15/2018 1350 Cytology called stating an order was placed for a cytology test but swab never arrived to lab at [facility].

## 2022-11-25 ENCOUNTER — Other Ambulatory Visit: Payer: Self-pay

## 2022-11-25 ENCOUNTER — Emergency Department (HOSPITAL_COMMUNITY): Payer: Medicaid Other

## 2022-11-25 ENCOUNTER — Encounter (HOSPITAL_COMMUNITY): Payer: Self-pay

## 2022-11-25 ENCOUNTER — Inpatient Hospital Stay (HOSPITAL_COMMUNITY)
Admission: EM | Admit: 2022-11-25 | Discharge: 2022-11-27 | DRG: 101 | Disposition: A | Discharge status: Home / Self Care | Payer: Medicaid Other | Attending: Internal Medicine | Admitting: Internal Medicine

## 2022-11-25 DIAGNOSIS — F10930 Alcohol use, unspecified with withdrawal, uncomplicated: Secondary | ICD-10-CM

## 2022-11-25 DIAGNOSIS — T7411XA Adult physical abuse, confirmed, initial encounter: Secondary | ICD-10-CM | POA: Diagnosis present

## 2022-11-25 DIAGNOSIS — Z8249 Family history of ischemic heart disease and other diseases of the circulatory system: Secondary | ICD-10-CM

## 2022-11-25 DIAGNOSIS — Z79899 Other long term (current) drug therapy: Secondary | ICD-10-CM

## 2022-11-25 DIAGNOSIS — F250 Schizoaffective disorder, bipolar type: Secondary | ICD-10-CM | POA: Diagnosis present

## 2022-11-25 DIAGNOSIS — G40909 Epilepsy, unspecified, not intractable, without status epilepticus: Principal | ICD-10-CM

## 2022-11-25 DIAGNOSIS — F419 Anxiety disorder, unspecified: Secondary | ICD-10-CM | POA: Diagnosis present

## 2022-11-25 DIAGNOSIS — F430 Acute stress reaction: Secondary | ICD-10-CM | POA: Diagnosis present

## 2022-11-25 DIAGNOSIS — F10920 Alcohol use, unspecified with intoxication, uncomplicated: Secondary | ICD-10-CM | POA: Diagnosis not present

## 2022-11-25 DIAGNOSIS — R569 Unspecified convulsions: Secondary | ICD-10-CM

## 2022-11-25 DIAGNOSIS — T1491XA Suicide attempt, initial encounter: Secondary | ICD-10-CM | POA: Diagnosis not present

## 2022-11-25 DIAGNOSIS — T50902A Poisoning by unspecified drugs, medicaments and biological substances, intentional self-harm, initial encounter: Secondary | ICD-10-CM | POA: Diagnosis not present

## 2022-11-25 DIAGNOSIS — E876 Hypokalemia: Secondary | ICD-10-CM | POA: Diagnosis present

## 2022-11-25 DIAGNOSIS — Z91148 Patient's other noncompliance with medication regimen for other reason: Secondary | ICD-10-CM

## 2022-11-25 DIAGNOSIS — F10939 Alcohol use, unspecified with withdrawal, unspecified: Secondary | ICD-10-CM | POA: Insufficient documentation

## 2022-11-25 DIAGNOSIS — E875 Hyperkalemia: Secondary | ICD-10-CM | POA: Diagnosis present

## 2022-11-25 DIAGNOSIS — T426X6A Underdosing of other antiepileptic and sedative-hypnotic drugs, initial encounter: Secondary | ICD-10-CM | POA: Diagnosis present

## 2022-11-25 DIAGNOSIS — Z842 Family history of other diseases of the genitourinary system: Secondary | ICD-10-CM | POA: Diagnosis not present

## 2022-11-25 DIAGNOSIS — Z91138 Patient's unintentional underdosing of medication regimen for other reason: Secondary | ICD-10-CM

## 2022-11-25 DIAGNOSIS — H1131 Conjunctival hemorrhage, right eye: Secondary | ICD-10-CM | POA: Diagnosis present

## 2022-11-25 DIAGNOSIS — R45850 Homicidal ideations: Secondary | ICD-10-CM | POA: Diagnosis present

## 2022-11-25 DIAGNOSIS — Y905 Blood alcohol level of 100-119 mg/100 ml: Secondary | ICD-10-CM | POA: Diagnosis present

## 2022-11-25 DIAGNOSIS — R45851 Suicidal ideations: Secondary | ICD-10-CM | POA: Diagnosis present

## 2022-11-25 DIAGNOSIS — T7491XA Unspecified adult maltreatment, confirmed, initial encounter: Secondary | ICD-10-CM | POA: Diagnosis present

## 2022-11-25 DIAGNOSIS — F4310 Post-traumatic stress disorder, unspecified: Secondary | ICD-10-CM | POA: Diagnosis present

## 2022-11-25 DIAGNOSIS — D509 Iron deficiency anemia, unspecified: Secondary | ICD-10-CM | POA: Diagnosis present

## 2022-11-25 DIAGNOSIS — F1010 Alcohol abuse, uncomplicated: Secondary | ICD-10-CM | POA: Diagnosis present

## 2022-11-25 DIAGNOSIS — F209 Schizophrenia, unspecified: Secondary | ICD-10-CM | POA: Insufficient documentation

## 2022-11-25 DIAGNOSIS — F32A Depression, unspecified: Secondary | ICD-10-CM | POA: Insufficient documentation

## 2022-11-25 LAB — COMPREHENSIVE METABOLIC PANEL
ALT: 15 U/L (ref 0–44)
AST: 15 U/L (ref 15–41)
Albumin: 3.7 g/dL (ref 3.5–5.0)
Alkaline Phosphatase: 45 U/L (ref 38–126)
Anion gap: 9 (ref 5–15)
BUN: 10 mg/dL (ref 6–20)
CO2: 20 mmol/L — ABNORMAL LOW (ref 22–32)
Calcium: 8.8 mg/dL — ABNORMAL LOW (ref 8.9–10.3)
Chloride: 108 mmol/L (ref 98–111)
Creatinine, Ser: 0.79 mg/dL (ref 0.44–1.00)
GFR, Estimated: >60 mL/min (ref >60)
Glucose, Bld: 94 mg/dL (ref 70–99)
Potassium: 3.3 mmol/L — ABNORMAL LOW (ref 3.5–5.1)
Sodium: 137 mmol/L (ref 135–145)
Total Bilirubin: 0.6 mg/dL (ref 0.3–1.2)
Total Protein: 7.7 g/dL (ref 6.5–8.1)

## 2022-11-25 LAB — FERRITIN: Ferritin: 25 ng/mL (ref 11–307)

## 2022-11-25 LAB — IRON AND TIBC
Iron: 52 ug/dL (ref 28–170)
Saturation Ratios: 13 % (ref 10.4–31.8)
TIBC: 409 ug/dL (ref 250–450)
UIBC: 357 ug/dL

## 2022-11-25 LAB — ACETAMINOPHEN LEVEL
Acetaminophen (Tylenol), Serum: <10 ug/mL — ABNORMAL LOW (ref 10–30)
Acetaminophen (Tylenol), Serum: <10 ug/mL — ABNORMAL LOW (ref 10–30)

## 2022-11-25 LAB — URINALYSIS, ROUTINE W REFLEX MICROSCOPIC
Bilirubin Urine: NEGATIVE
Glucose, UA: NEGATIVE mg/dL
Hgb urine dipstick: NEGATIVE
Ketones, ur: NEGATIVE mg/dL
Leukocytes,Ua: NEGATIVE
Nitrite: NEGATIVE
Protein, ur: NEGATIVE mg/dL
Specific Gravity, Urine: 1.005 (ref 1.005–1.030)
pH: 6 (ref 5.0–8.0)

## 2022-11-25 LAB — CBC WITH DIFFERENTIAL/PLATELET
Abs Immature Granulocytes: 0.02 10*3/uL (ref 0.00–0.07)
Basophils Absolute: 0 10*3/uL (ref 0.0–0.1)
Basophils Relative: 1 %
Eosinophils Absolute: 0.1 10*3/uL (ref 0.0–0.5)
Eosinophils Relative: 1 %
HCT: 34.4 % — ABNORMAL LOW (ref 36.0–46.0)
Hemoglobin: 10.8 g/dL — ABNORMAL LOW (ref 12.0–15.0)
Immature Granulocytes: 0 %
Lymphocytes Relative: 25 %
Lymphs Abs: 1.9 10*3/uL (ref 0.7–4.0)
MCH: 22.7 pg — ABNORMAL LOW (ref 26.0–34.0)
MCHC: 31.4 g/dL (ref 30.0–36.0)
MCV: 72.3 fL — ABNORMAL LOW (ref 80.0–100.0)
Monocytes Absolute: 0.6 10*3/uL (ref 0.1–1.0)
Monocytes Relative: 8 %
Neutro Abs: 5 10*3/uL (ref 1.7–7.7)
Neutrophils Relative %: 65 %
Platelets: 264 10*3/uL (ref 150–400)
RBC: 4.76 MIL/uL (ref 3.87–5.11)
RDW: 14.1 % (ref 11.5–15.5)
WBC: 7.6 10*3/uL (ref 4.0–10.5)
nRBC: 0 % (ref 0.0–0.2)

## 2022-11-25 LAB — RAPID URINE DRUG SCREEN, HOSP PERFORMED
Amphetamines: NOT DETECTED
Barbiturates: NOT DETECTED
Benzodiazepines: POSITIVE — AB
Cocaine: NOT DETECTED
Opiates: NOT DETECTED
Tetrahydrocannabinol: NOT DETECTED

## 2022-11-25 LAB — RETICULOCYTES
Immature Retic Fract: 14.8 % (ref 2.3–15.9)
RBC.: 4.91 MIL/uL (ref 3.87–5.11)
Retic Count, Absolute: 66.8 10*3/uL (ref 19.0–186.0)
Retic Ct Pct: 1.4 % (ref 0.4–3.1)

## 2022-11-25 LAB — PREGNANCY, URINE: Preg Test, Ur: NEGATIVE

## 2022-11-25 LAB — ETHANOL: Alcohol, Ethyl (B): 110 mg/dL — ABNORMAL HIGH (ref <10)

## 2022-11-25 LAB — SALICYLATE LEVEL: Salicylate Lvl: <7 mg/dL — ABNORMAL LOW (ref 7.0–30.0)

## 2022-11-25 MED ORDER — LEVETIRACETAM IN NACL 1000 MG/100ML IV SOLN
1000.0000 mg | Freq: Once | INTRAVENOUS | Status: AC
Start: 2022-11-25 — End: 2022-11-25
  Administered 2022-11-25: 1000 mg via INTRAVENOUS
  Filled 2022-11-25: qty 100

## 2022-11-25 MED ORDER — LORAZEPAM 2 MG/ML IJ SOLN
INTRAMUSCULAR | Status: AC
  Administered 2022-11-25: 2 mg
  Filled 2022-11-25: qty 1

## 2022-11-25 MED ORDER — ADULT MULTIVITAMIN W/MINERALS CH
1.0000 | ORAL_TABLET | Freq: Every day | ORAL | Status: DC
  Administered 2022-11-25 – 2022-11-26 (×2): 1 via ORAL
  Filled 2022-11-25 (×3): qty 1

## 2022-11-25 MED ORDER — PANTOPRAZOLE SODIUM 40 MG PO TBEC
40.0000 mg | DELAYED_RELEASE_TABLET | Freq: Every day | ORAL | Status: DC
  Administered 2022-11-25 – 2022-11-27 (×3): 40 mg via ORAL
  Filled 2022-11-25 (×3): qty 1

## 2022-11-25 MED ORDER — ENSURE ENLIVE PO LIQD
237.0000 mL | Freq: Two times a day (BID) | ORAL | Status: DC
  Administered 2022-11-26 – 2022-11-27 (×2): 237 mL via ORAL

## 2022-11-25 MED ORDER — FOLIC ACID 1 MG PO TABS
1.0000 mg | ORAL_TABLET | Freq: Every day | ORAL | Status: DC
  Administered 2022-11-25 – 2022-11-27 (×3): 1 mg via ORAL
  Filled 2022-11-25 (×3): qty 1

## 2022-11-25 MED ORDER — ONDANSETRON 4 MG PO TBDP
4.0000 mg | ORAL_TABLET | Freq: Three times a day (TID) | ORAL | Status: DC | PRN
  Administered 2022-11-25: 4 mg via ORAL
  Filled 2022-11-25 (×2): qty 1

## 2022-11-25 MED ORDER — LEVETIRACETAM 500 MG PO TABS
500.0000 mg | ORAL_TABLET | Freq: Two times a day (BID) | ORAL | Status: DC
  Administered 2022-11-26 – 2022-11-27 (×3): 500 mg via ORAL
  Filled 2022-11-25 (×4): qty 1

## 2022-11-25 MED ORDER — SERTRALINE HCL 25 MG PO TABS
25.0000 mg | ORAL_TABLET | Freq: Every day | ORAL | Status: DC
  Administered 2022-11-25 – 2022-11-27 (×3): 25 mg via ORAL
  Filled 2022-11-25 (×3): qty 1

## 2022-11-25 MED ORDER — LORAZEPAM 2 MG/ML IJ SOLN: 1.0000 mg | INTRAMUSCULAR | Status: DC | PRN

## 2022-11-25 MED ORDER — LORAZEPAM 2 MG/ML IJ SOLN: 2.0000 mg | Freq: Once | INTRAMUSCULAR | Status: AC

## 2022-11-25 MED ORDER — ARIPIPRAZOLE 5 MG PO TABS
5.0000 mg | ORAL_TABLET | Freq: Every day | ORAL | Status: DC
  Administered 2022-11-25 – 2022-11-27 (×3): 5 mg via ORAL
  Filled 2022-11-25 (×3): qty 1

## 2022-11-25 MED ORDER — THIAMINE MONONITRATE 100 MG PO TABS
100.0000 mg | ORAL_TABLET | Freq: Every day | ORAL | Status: DC
  Administered 2022-11-25 – 2022-11-27 (×3): 100 mg via ORAL
  Filled 2022-11-25 (×3): qty 1

## 2022-11-25 MED ORDER — LEVETIRACETAM IN NACL 1000 MG/100ML IV SOLN
1000.0000 mg | Freq: Once | INTRAVENOUS | Status: AC
  Administered 2022-11-25: 1000 mg via INTRAVENOUS
  Filled 2022-11-25: qty 100

## 2022-11-25 MED ORDER — LORAZEPAM 2 MG/ML IJ SOLN
INTRAMUSCULAR | Status: AC
  Administered 2022-11-25: 2 mg via INTRAVENOUS
  Filled 2022-11-25: qty 1

## 2022-11-25 MED ORDER — LORAZEPAM 1 MG PO TABS: 1.0000 mg | ORAL_TABLET | ORAL | Status: DC | PRN

## 2022-11-25 MED ORDER — THIAMINE HCL 100 MG/ML IJ SOLN: 100.0000 mg | Freq: Every day | INTRAMUSCULAR | Status: DC

## 2022-11-25 MED ORDER — LEVETIRACETAM IN NACL 500 MG/100ML IV SOLN
500.0000 mg | Freq: Once | INTRAVENOUS | Status: AC
  Administered 2022-11-26: 500 mg via INTRAVENOUS
  Filled 2022-11-25: qty 100

## 2022-11-25 NOTE — Progress Notes (Addendum)
Patient ID: Donna Guzman, female   DOB: [birth year], 22 y.o.   MRN: [id number] Attempted to see patient and multiple staff were attending to her. Patient had another seizure.  Will attempt reassessment at a later time.

## 2022-11-25 NOTE — ED Notes (Signed)
Pt friends at bedside 

## 2022-11-25 NOTE — Assessment & Plan Note (Signed)
-   We will check serum iron and TIBC as well as ferritin.

## 2022-11-25 NOTE — Assessment & Plan Note (Signed)
-   She is not sure if she has schizophrenia and bipolar disorder just she thinks likely is her condition or schizoaffective bipolar disorder. - We will continue Abilify.

## 2022-11-25 NOTE — Assessment & Plan Note (Addendum)
-   Differential diagnoses include seizure disorder or epilepsy. - She will be admitted to a medical telemetry bed at [facility]. - Should be placed on seizure precautions. - Will obtain an EEG. - We will place on as needed IV Ativan. - Neurology consult to be obtained. - I notified Dr. Amada Jupiter and Dr. Derry Lory bout the patient.

## 2022-11-25 NOTE — ED Notes (Signed)
Pt stated "the last thing I remember is drinking alcohol"  Pt stated she consumes alcohol every day

## 2022-11-25 NOTE — ED Provider Notes (Addendum)
Care assumed at shift change. Here for intentional overdose, also had ETOH on board on arrival and has had two seizures while in the ED, treated with ativan and loaded with keppra. 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 Will discuss with Hospitalist.   5:51 PM Spoke with Dr. Arville Care, Hospitalist, who will accept for admission.     6:25 PM Patient initially cooperative, but now not cooperating with admission process or safety precautions. IVC filled out   Pollyann Savoy, MD 11/25/22 [phone]

## 2022-11-25 NOTE — Assessment & Plan Note (Signed)
-   Potassium will be replaced and magnesium level will be checked. ?

## 2022-11-25 NOTE — Assessment & Plan Note (Signed)
-   Will be placed on CIWA protocol.

## 2022-11-25 NOTE — ED Notes (Signed)
Pt arrived to ED for seizures and ibuprofen overdose. While being transported back from CT scan. Pt Reported feeling like a seizure was going to occur. Pt jerking in bed while drooling upon RN arrival to room. MD notified. 2MG  ativan administered, and keppra ordered/given. Pt had 6 episodes of seizure activity between 0225 and 0240.

## 2022-11-25 NOTE — Plan of Care (Signed)

## 2022-11-25 NOTE — ED Notes (Signed)
Pt had seizure, starting at 1220,  Pt was suctioned and rolled on her side Administered 2mg  ATIVAN @ 1223 Pt came to and was post ictal @1226 

## 2022-11-25 NOTE — ED Triage Notes (Signed)
Pt BIB [facility] from home. Pt reports taking 20 tablets of ibuprofen in attempts to kill herself. Upon ems arrival pt had two witnessed seizures. 5mg  IM midazolam administered by ems. Hx epilepsy. Pt has visible trauma to right eye.   140/90BP 110HR 95CBG 99% RA RR 30

## 2022-11-25 NOTE — ED Notes (Signed)
Poison control recommends  repeat bmp, tylenol, and ekg in 4 hours.
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
## 2022-11-25 NOTE — ED Notes (Signed)
[facility] called for transport. 

## 2022-11-25 NOTE — Assessment & Plan Note (Signed)
-   We will continue Ativan and Zoloft.

## 2022-11-25 NOTE — ED Notes (Signed)
Provided pt sausage biscuit and juice.

## 2022-11-25 NOTE — ED Notes (Signed)
[facility] called pts friend Bernadene Bell) for collateral. Bernadene Bell confirmed that pt was drinking last night but was unsure if she had taken the 20 ibuprofen tablets. Pts friend also confirmed that she has a history of seizures with her last seizure about 1-2 motnhs ago. Pts friend connected on a three way call to pts mother in [place] (Jaquay Tillman). Pts mother reports that pt was previously diagnosed with schizophrenia, bipolar disorder, anxiety, depression, ADHD and seizure disorder.  Pt was taking medication 2 years ago when she lived in [place] but has not taken meds since she arrived in [place]. Pt has not accessed any outpatient mental health treatment here in the area. Pts mother reports that pt has a history of several suicide attempts starting in her teens, around 19 when she took pills and broadcast it live on facebook. Mother reports that this is pts first attempt of suicide as an adult.   Pts mother would like to be notified of her disposition.   Jacquelynn Cree, [facility]  11/25/22
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
## 2022-11-25 NOTE — Assessment & Plan Note (Signed)
-   We will continue her Keppra.

## 2022-11-25 NOTE — Progress Notes (Signed)
Patient has been denied by [facility] due to no appropriate beds available. Patient meets [facility] inpatient criteria per Phebe Colla, NP. Patient has been faxed out to the following facilities:   [facility] Pending - Request [address] [place] [phone]--ZHYQM-VHQION [place] Pending - Request [place] [phone]--HKVQQ-VZDG [facility] Pending - Request Sent--[address] [place] [phone]--FUXNA-TFTDD [facility] Pending - Request Sent--[address] [place] [phone]--VPXTG-GYIRSWNI [facility] Pending - Request [address], [place] [phone]--FYBOF-BPZWCH [facility] Patient Placement Pending - Request Sent--[facility] [phone]--[facility] Pending - Request [address] [place] [phone]--TIWPY-KDX [facility] Pending - Request Sent--[address] [place] [phone]--BDZHG-DJMEQ [facility] Pending - Request [address] [place] [phone]--XKGYJ-EHUDJ [facility] Pending - Request Sent--[address] [place] [phone]--[facility] Pending - Request Sent--[address] [facility] Pending - Request Sent--[address] [place] [phone]--[facility] Pending - Request [address] [place], [place] [phone]--TZGYF-VCBSWHQP [facility] Pending - Request Sent--[address] [place] [phone]--SPQZR-AQTM [facility] Pending - Request Sent--[address]  [place] [phone]--TLXBW-IOMBTDH [facility] Pending - Request Sent--[address] [place] [facility] [phone]--[facility] Pending - Request [address] [place] [phone]--  Damita Dunnings, MSW, LCSW-A  10:29 PM 11/25/2022
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
## 2022-11-25 NOTE — Assessment & Plan Note (Signed)
-   This is a potential though she said she took ibuprofen for pain relief. - She was evaluated by psychiatry and recommendation was for psychiatry admission after 72 hours for alcohol detox.

## 2022-11-25 NOTE — ED Provider Notes (Signed)
Donna Guzman   CSN: [id number] Arrival date & time: 11/25/22  0136     History  Chief Complaint  Patient presents with   Suicidal    Donna Guzman is a 22 y.o. female.  HPI     This is a 22 year old female brought in by EMS with concern for intentional overdose.  Per EMS, they were called out for intentional ingestion of ibuprofen.  Upon their arrival, patient had 2 witnessed seizures.  She was administered IM medications by EMS.  Reportedly her name is Donna Guzman; however, this has been unable to be confirmed.  I have looked up this chart and this patient does have a history of seizures.  Patient does not provide any collateral information at this time.  Level 5 caveat  Home Medications Prior to Admission medications   Not on File      Allergies    Patient has no allergy information on record.    Review of Systems   Review of Systems  Unable to perform ROS: Mental status change    Physical Exam Updated Vital Signs BP 116/70 (BP Location: Right Arm)   Pulse (!) 112   Temp 98.5 F (36.9 C) (Oral)   Resp (!) 28   Ht 1.664 m (5' 5.5")   Wt 74.8 kg   SpO2 98%   BMI 27.04 kg/m  Physical Exam Vitals and nursing Guzman reviewed.  Constitutional:      Appearance: She is well-developed. She is not ill-appearing.     Comments: Somnolent, arouses to tactile stimulus  HENT:     Head: Normocephalic and atraumatic.  Eyes:     Pupils: Pupils are equal, round, and reactive to light.     Comments: Pupils twos and minimally reactive, right subconjunctival hemorrhage  Cardiovascular:     Rate and Rhythm: Normal rate and regular rhythm.     Heart sounds: Normal heart sounds.  Pulmonary:     Effort: Pulmonary effort is normal. No respiratory distress.     Breath sounds: No wheezing.  Abdominal:     Palpations: Abdomen is soft.  Musculoskeletal:     Cervical back: Neck supple.  Skin:    General: Skin is warm  and dry.  Neurological:     Comments: Unable to assess orientation, appears to move all 4 extremities spontaneously  Psychiatric:     Comments: Unable to assess     ED Results / Procedures / Treatments   Labs (all labs ordered are listed, but only abnormal results are displayed) Labs Reviewed  CBC WITH DIFFERENTIAL/PLATELET - Abnormal; Notable for the following components:      Result Value   Hemoglobin 10.8 (*)    HCT 34.4 (*)    MCV 72.3 (*)    MCH 22.7 (*)    All other components within normal limits  COMPREHENSIVE METABOLIC PANEL - Abnormal; Notable for the following components:   Potassium 3.3 (*)    CO2 20 (*)    Calcium 8.8 (*)    All other components within normal limits  URINALYSIS, ROUTINE W REFLEX MICROSCOPIC - Abnormal; Notable for the following components:   Color, Urine STRAW (*)    All other components within normal limits  ACETAMINOPHEN LEVEL - Abnormal; Notable for the following components:   Acetaminophen (Tylenol), Serum <10 (*)    All other components within normal limits  SALICYLATE LEVEL - Abnormal; Notable for the following components:   Salicylate Lvl <7.0 (*)  All other components within normal limits  RAPID URINE DRUG SCREEN, HOSP PERFORMED - Abnormal; Notable for the following components:   Benzodiazepines POSITIVE (*)    All other components within normal limits  ETHANOL - Abnormal; Notable for the following components:   Alcohol, Ethyl (B) 110 (*)    All other components within normal limits  ACETAMINOPHEN LEVEL - Abnormal; Notable for the following components:   Acetaminophen (Tylenol), Serum <10 (*)    All other components within normal limits  PREGNANCY, URINE    EKG None  Radiology CT Head Wo Contrast  Result Date: 11/25/2022 CLINICAL DATA:  Seizure, new-onset, no history of trauma Pt tried to kill herself by taking 20 tablets of ibuprofen, pt had 2 seizures, witnessed by EMS, injury to rt eye, hx of epilepsy, no known prev ct  head EXAM: CT HEAD WITHOUT CONTRAST TECHNIQUE: Contiguous axial images were obtained from the base of the skull through the vertex without intravenous contrast. RADIATION DOSE REDUCTION: This exam was performed according to the departmental dose-optimization program which includes automated exposure control, adjustment of the mA and/or kV according to patient size and/or use of iterative reconstruction technique. COMPARISON:  None Available. FINDINGS: Brain: No evidence of large-territorial acute infarction. No parenchymal hemorrhage. No mass lesion. No extra-axial collection. No mass effect or midline shift. No hydrocephalus. Basilar cisterns are patent. Vascular: No hyperdense vessel. Skull: No acute fracture or focal lesion. Sinuses/Orbits: Bilateral maxillary sinus mucosal thickening. Otherwise paranasal sinuses and mastoid air cells are clear. The orbits are unremarkable. Other: None. IMPRESSION: No acute intracranial abnormality. Electronically Signed   By: Tish Frederickson M.D.   On: 11/25/2022 03:18    Procedures .Critical Care  Performed by: Shon Baton, MD Authorized by: Shon Baton, MD   Critical care provider statement:    Critical care time (minutes):  65   Critical care was necessary to treat or prevent imminent or life-threatening deterioration of the following conditions:  Toxidrome (Recurrent seizure activity)   Critical care was time spent personally by me on the following activities:  Development of treatment plan with patient or surrogate, discussions with consultants, evaluation of patient's response to treatment, examination of patient, ordering and review of laboratory studies, ordering and review of radiographic studies, ordering and performing treatments and interventions, pulse oximetry, re-evaluation of patient's condition and review of old charts     Medications Ordered in ED Medications  LORazepam (ATIVAN) 2 MG/ML injection (2 mg  Given 11/25/22 0225)   levETIRAcetam (KEPPRA) IVPB 1000 mg/100 mL premix (0 mg Intravenous Stopped 11/25/22 0311)    ED Course/ Medical Decision Making/ A&P Clinical Course as of 11/25/22 0638  Wed Nov 25, 2022  1610 Donna Guzman July 30, 2000 [CH]  0232 Responded to the patient's room for seizure-like activity.  Patient with tonic-clonic movements, drooling, deviated eye movements.  She was unresponsive at the time.  Initially aborted but episode recurred and was given 2 of Ativan.  I have reviewed her other chart.  It appears that she is supposed to take Keppra but has been noncompliant in the past.  Will load with a gram of Keppra. [CH]  0351 Vital signs stable.  Remains sedated. [CH]  0524 Continues to be somnolent.  Vital signs stable. [CH]    Clinical Course User Index [CH] Karalyn Kadel, Mayer Masker, MD  Medical Decision Making Amount and/or Complexity of Data Reviewed Labs: ordered. Radiology: ordered.  Risk Prescription drug management.   This patient presents to the ED for concern of overdose, seizure, this involves an extensive number of treatment options, and is a complaint that carries with it a high risk of complications and morbidity.  I considered the following differential and admission for this acute, potentially life threatening condition.  The differential diagnosis includes life-threatening overdose, status, recurrent seizure secondary to medication indiscretion, metabolic derangement, intoxication, polysubstance abuse  MDM:    This is a 22 year old female who presents by EMS with reported overdose on ibuprofen.  Was noted to have a seizure.  Initially was put in as a Donna Guzman because her identity could not be verified.  However, her name is Donna Guzman and she does have a history of seizures.  She does not wake up enough for me to confirm whether she has been taking her meds.  She ultimately had recurrent seizure activity and required 2 mg of Ativan.  She was loaded  with Keppra.  She has a history of noncompliance with her seizure medications.  From an overdose standpoint, Tylenol and salicylate levels were obtained and are negative.  Basic metabolic panel is reassuring.  Pregnancy is negative.  Repeat Tylenol level at 4 hours was negative.  Alcohol level 110.  CT head shows no evidence of intracranial injury.  EKG shows sinus tachycardia without acute arrhythmia.  On multiple rechecks, patient remains hemodynamically stable but is significantly somnolent likely secondary to multiple medications.  Will have her reassess.  She will need to be reassessed for suicidality and intent given reports by EMS.  (Labs, imaging, consults)  Labs: I Ordered, and personally interpreted labs.  The pertinent results include: CBC, CMP, Tylenol and salicylate levels, urine pregnancy, EtOH  Imaging Studies ordered: I ordered imaging studies including CT head I independently visualized and interpreted imaging. I agree with the radiologist interpretation  Additional history obtained from chart review.  External records from outside source obtained and reviewed including prior evaluations   Cardiac Monitoring: The patient was maintained on a cardiac monitor.  If on the cardiac monitor, I personally viewed and interpreted the cardiac monitored which showed an underlying rhythm of: sinus tachycardia  Reevaluation: After the interventions noted above, I reevaluated the patient and found that they have :stayed the same  Social Determinants of Health:  lives independently  Disposition: Pending  Co morbidities that complicate the patient evaluation No past medical history on file.   Medicines Meds ordered this encounter  Medications   LORazepam (ATIVAN) 2 MG/ML injection    Aboagye, Koffi P: cabinet override   levETIRAcetam (KEPPRA) IVPB 1000 mg/100 mL premix    I have reviewed the patients home medicines and have made adjustments as needed  Problem List / ED  Course: Problem List Items Addressed This Visit   None               Final Clinical Impression(s) / ED Diagnoses Final diagnoses:  None    Rx / DC Orders ED Discharge Orders     None         Shon Baton, MD 11/25/22 [phone]

## 2022-11-26 ENCOUNTER — Inpatient Hospital Stay (HOSPITAL_COMMUNITY): Payer: Medicaid Other

## 2022-11-26 ENCOUNTER — Encounter (HOSPITAL_COMMUNITY): Payer: Self-pay | Admitting: Family Medicine

## 2022-11-26 DIAGNOSIS — Z91148 Patient's other noncompliance with medication regimen for other reason: Secondary | ICD-10-CM

## 2022-11-26 DIAGNOSIS — F250 Schizoaffective disorder, bipolar type: Secondary | ICD-10-CM | POA: Diagnosis not present

## 2022-11-26 DIAGNOSIS — T50902A Poisoning by unspecified drugs, medicaments and biological substances, intentional self-harm, initial encounter: Secondary | ICD-10-CM

## 2022-11-26 DIAGNOSIS — G40909 Epilepsy, unspecified, not intractable, without status epilepticus: Secondary | ICD-10-CM

## 2022-11-26 DIAGNOSIS — F10920 Alcohol use, unspecified with intoxication, uncomplicated: Secondary | ICD-10-CM | POA: Diagnosis not present

## 2022-11-26 LAB — COMPREHENSIVE METABOLIC PANEL
ALT: 14 U/L (ref 0–44)
AST: 19 U/L (ref 15–41)
Albumin: 3.3 g/dL — ABNORMAL LOW (ref 3.5–5.0)
Alkaline Phosphatase: 39 U/L (ref 38–126)
Anion gap: 7 (ref 5–15)
BUN: 10 mg/dL (ref 6–20)
CO2: 24 mmol/L (ref 22–32)
Calcium: 9.2 mg/dL (ref 8.9–10.3)
Chloride: 106 mmol/L (ref 98–111)
Creatinine, Ser: 0.84 mg/dL (ref 0.44–1.00)
GFR, Estimated: >60 mL/min (ref >60)
Glucose, Bld: 112 mg/dL — ABNORMAL HIGH (ref 70–99)
Potassium: 3.8 mmol/L (ref 3.5–5.1)
Sodium: 137 mmol/L (ref 135–145)
Total Bilirubin: 0.5 mg/dL (ref 0.3–1.2)
Total Protein: 7 g/dL (ref 6.5–8.1)

## 2022-11-26 LAB — CBC
HCT: 35 % — ABNORMAL LOW (ref 36.0–46.0)
Hemoglobin: 10.7 g/dL — ABNORMAL LOW (ref 12.0–15.0)
MCH: 21.7 pg — ABNORMAL LOW (ref 26.0–34.0)
MCHC: 30.6 g/dL (ref 30.0–36.0)
MCV: 70.9 fL — ABNORMAL LOW (ref 80.0–100.0)
Platelets: 332 10*3/uL (ref 150–400)
RBC: 4.94 MIL/uL (ref 3.87–5.11)
RDW: 14.5 % (ref 11.5–15.5)
WBC: 5.6 10*3/uL (ref 4.0–10.5)
nRBC: 0 % (ref 0.0–0.2)

## 2022-11-26 MED ORDER — PROPRANOLOL HCL 10 MG PO TABS
10.0000 mg | ORAL_TABLET | Freq: Two times a day (BID) | ORAL | Status: DC
  Administered 2022-11-26 – 2022-11-27 (×3): 10 mg via ORAL
  Filled 2022-11-26 (×4): qty 1
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
## 2022-11-26 NOTE — Consult Note (Signed)
NEUROLOGY CONSULT NOTE   Date of service: November 26, 2022 Patient Name: Donna Guzman MRN:  [id number] DOB:  [birth year] Chief Complaint: "seizures" Requesting Provider: Hannah Beat, MD  History of Present Illness  Donna Guzman is a 22 y.o. 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 Had 2 seizures witnessed by EMS and given Versed. EtOH levels were elevated to 110 on arrival. In the ED, had 2 more seizures. Labs with mild anemia, hyperkalemia with UDS positive for benzos probably from the Versed EMS gave her.  She was loaded with Keppra 2000mg  IV. She was transferred and admitted to [facility] for neurology evaluation. She was also started on a CIWA protocol.  Here, she is somnolent likely from a combination of benzos and post ictal. Sitter in the room. She reports non compliance with Keppra and not been taking it. No specific reason except that there is a lot going on right now.   ROS  Unable to ascertain due to somnolence. Patient denies any pain or complaints right now.  Past History  History reviewed. No pertinent past medical history.  History reviewed. No pertinent surgical history.  Family History: History reviewed. No pertinent family history.  Social History  reports current alcohol use. She reports that she does not currently use drugs. No history on file for tobacco use.  No Known Allergies  Medications   Current Facility-Administered Medications:    ARIPiprazole (ABILIFY) tablet 5 mg, 5 mg, Oral, Daily, Weber, Kyra A, NP, 5 mg at 11/25/22 1750   feeding supplement (ENSURE ENLIVE / ENSURE PLUS) liquid 237 mL, 237 mL, Oral, BID BM, Mansy, Jan A, MD   folic acid (FOLVITE) tablet 1 mg, 1 mg, Oral, Daily, Mansy, Jan A, MD, 1 mg at 11/25/22 1857   levETIRAcetam (KEPPRA) tablet 500 mg, 500 mg, Oral, BID, Adela Lank, Dan, DO   LORazepam (ATIVAN) tablet 1-4 mg, 1-4 mg, Oral, Q1H PRN **OR**  LORazepam (ATIVAN) injection 1-4 mg, 1-4 mg, Intravenous, Q1H PRN, Mansy, Jan A, MD   multivitamin with minerals tablet 1 tablet, 1 tablet, Oral, Daily, Mansy, Jan A, MD, 1 tablet at 11/25/22 1857   ondansetron (ZOFRAN-ODT) disintegrating tablet 4 mg, 4 mg, Oral, Q8H PRN, Pollyann Savoy, MD, 4 mg at 11/25/22 1701   pantoprazole (PROTONIX) EC tablet 40 mg, 40 mg, Oral, Daily, Mansy, Jan A, MD, 40 mg at 11/25/22 1856   sertraline (ZOLOFT) tablet 25 mg, 25 mg, Oral, Daily, Weber, Kyra A, NP, 25 mg at 11/25/22 1748   thiamine (VITAMIN B1) tablet 100 mg, 100 mg, Oral, Daily, 100 mg at 11/25/22 1900 **OR** thiamine (VITAMIN B1) injection 100 mg, 100 mg, Intravenous, Daily, Mansy, Jan A, MD  Vitals   Vitals:   11/25/22 1545 11/25/22 1837 11/25/22 1915 11/25/22 2304  BP: 118/80  108/69 104/71  Pulse: (!) 103 93 91 83  Resp: 18 19 (!) 21   Temp:  98.7 F (37.1 C)  98.9 F (37.2 C)  TempSrc:  Oral  Oral  SpO2: 100% 100% 100% 100%  Weight:      Height:        Body mass index is 27.04 kg/m.  Physical Exam   Constitutional: Appears well-developed and well-nourished.  Psych: Affect appropriate to situation.  Eyes: No scleral injection.  HENT: No OP obstruction.  Head: Normocephalic.  Cardiovascular: Normal rate and regular rhythm.  Respiratory: Effort normal, non-labored breathing.  GI:  Soft.  No distension. There is no tenderness.  Skin: WDI.   Neurologic Examination  Mental status/Cognition: somnolent, oriented to self, place, thinks it sept but corrects herself to octoboer. Oriented to year, good attention. Speech/language: limited by somnolence. comprehension intact, object naming intact, repetition intact. Cranial nerves:   CN II Pupils equal and reactive to light, no VF deficits    CN III,IV,VI EOM intact, no gaze preference or deviation, no nystagmus    CN V normal sensation in V1, V2, and V3 segments bilaterally    CN VII no asymmetry, no nasolabial fold flattening    CN  VIII normal hearing to speech    CN IX & X normal palatal elevation, no uvular deviation    CN XI 5/5 head turn and 5/5 shoulder shrug bilaterally    CN XII midline tongue protrusion    Motor:  Muscle bulk: normal, tone normal.  Limited effort due to somnolence.  Mvmt Root Nerve  Muscle Right Left Comments  SA C5/6 Ax Deltoid     EF C5/6 Mc Biceps     EE C6/7/8 Rad Triceps     WF C6/7 Med FCR     WE C7/8 PIN ECU     F Ab C8/T1 U ADM/FDI 4+ 4+   HF L1/2/3 Fem Illopsoas 4 4   KE L2/3/4 Fem Quad     DF L4/5 D Peron Tib Ant     PF S1/2 Tibial Grc/Sol      Sensation:  Light touch Intact throughout   Pin prick    Temperature    Vibration   Proprioception    Coordination/Complex Motor:  - Finger to Nose intact BL   Labs   CBC:  Recent Labs  Lab 11/25/22 0206  WBC 7.6  NEUTROABS 5.0  HGB 10.8*  HCT 34.4*  MCV 72.3*  PLT 264    Basic Metabolic Panel:  Lab Results  Component Value Date   NA 137 11/25/2022   K 3.3 (L) 11/25/2022   CO2 20 (L) 11/25/2022   GLUCOSE 94 11/25/2022   BUN 10 11/25/2022   CREATININE 0.79 11/25/2022   CALCIUM 8.8 (L) 11/25/2022   GFRNONAA >60 11/25/2022   Lipid Panel: No results found for: "LDLCALC" HgbA1c: No results found for: "HGBA1C" Urine Drug Screen:     Component Value Date/Time   LABOPIA NONE DETECTED 11/25/2022 0305   COCAINSCRNUR NONE DETECTED 11/25/2022 0305   LABBENZ POSITIVE (A) 11/25/2022 0305   AMPHETMU NONE DETECTED 11/25/2022 0305   THCU NONE DETECTED 11/25/2022 0305   LABBARB NONE DETECTED 11/25/2022 0305    Alcohol Level     Component Value Date/Time   ETH 110 (H) 11/25/2022 0206   INR No results found for: "INR" APTT No results found for: "APTT" AED levels: No results found for: "PHENYTOIN", "ZONISAMIDE", "LAMOTRIGINE", "LEVETIRACETA"   CT Head without contrast(Personally reviewed): CTH was negative for a large hypodensity concerning for a large territory infarct or hyperdensity concerning for an  ICH  rEEG:  pending  Impression   Donna Guzman is a 22 y.o. female with ?hx of seizures vs PNES non compliant with Keppra p/w Ibuprofen overdose and proceeded to have seizures and given Versed by EMS. Had 2 more seizures in the ED.  Somnolent at this time but non focal and follows commands and no clinical concerns for subclinical seizures. Does drink EtOH and levels elevated on presentation to 110.  Will resume home Keppra 500mg  BID and get a routine EEG.  Recommendations  -  rEEG - resume Keppra 500mg  BID - follow up with neurology outpatient. - Agree with CIWA protocol - no driving for 6 months. This will need to be reiterated when she is more awake. - we will be available on an as needed basis and will follow up on rEEG. We do not plan to actively follow her or see her. Please feel free to reach out with any questions or concerns. ______________________________________________________________________    Welton Flakes

## 2022-11-26 NOTE — Progress Notes (Signed)
EEG complete - results pending 

## 2022-11-26 NOTE — TOC Progression Note (Signed)
Transition of Care [facility]) - Progression Note    Patient Details  Name: Donna Guzman MRN: [id number] Date of Birth: Jun 29, 2000  Transition of Care [facility]) CM/SW Contact  Rhianne Soman A [place], [place] Phone Number: 11/26/2022, 4:42 PM  Clinical Narrative:      CSW made attempt to completed assessment with pt. She stated she was not feeling up to conversing and did not want to continue conversation. CSW to make second attempt at assessment at a more opportune time.       Expected Discharge Plan and Services                                               Social Determinants of Health (SDOH) Interventions SDOH Screenings   Food Insecurity: Unknown (11/25/2022)  Housing: High Risk (11/25/2022)  Transportation Needs: Unmet Transportation Needs (11/25/2022)  Utilities: Not At Risk (11/25/2022)    Readmission Risk Interventions     No data to display

## 2022-11-26 NOTE — Procedures (Signed)
Patient Name: Donna Guzman  MRN: [id number]  Epilepsy Attending: Charlsie Quest  Referring Physician/Provider: Hannah Beat, MD  Date: 11/26/2022 Duration: 25.26 mins  Patient history: 22 y.o. 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 EEG to evaluate for seizure  Level of alertness: Awake, asleep  AEDs during EEG study: LEV  Technical aspects: This EEG study was done with scalp electrodes positioned according to the 10-20 International system of electrode placement. Electrical activity was reviewed with band pass filter of 1-70Hz , sensitivity of 7 uV/mm, display speed of 52mm/sec with a 60Hz  notched filter applied as appropriate. EEG data were recorded continuously and digitally stored.  Video monitoring was available and reviewed as appropriate.  Description: The posterior dominant rhythm consists of 8 Hz activity of moderate voltage (25-35 uV) seen predominantly in posterior head regions, symmetric and reactive to eye opening and eye closing. Sleep was characterized by vertex waves, sleep spindles (12 to 14 Hz), maximal frontocentral region. Physiologic photic driving was not seen during photic stimulation.  Hyperventilation was not performed.     IMPRESSION: This study is within normal limits. No seizures or epileptiform discharges were seen throughout the recording.  A normal interictal EEG does not exclude the diagnosis of epilepsy.  [name]
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
## 2022-11-26 NOTE — Plan of Care (Signed)

## 2022-11-26 NOTE — Discharge Instructions (Addendum)
  Next week Please go to Clinic (this is the week before your scheduled visit) for an intake clinical assessment. This will be in addition to and before  your scheduled appointment.  Please present as a walk in M-W-F from 9 am-1 pm, but recommend showing up closer to 7 am in order to complete the paperwork.    Scheduled appointment the following week:  11/7 at 2:30 pm with Otila Back, PA  .     [facility] [address] [place], [place], [address] [phone] phone  EMERGENCY MENTAL HEALTH SERVICES are available 24/7 and located on the first floor.  OUTPATIENT (located on the second floor) Walk-in information: Please note, all walk-ins are first come & first serve, with limited number of available spots. Therapist for therapy:  Monday (female) & Wednesdays-Thursdays (female): Please ARRIVE at 7:00 AM for registration Will START at 8:00 AM Every 1st & 2nd Friday of the month: Please ARRIVE at 7:00 AM for registration Will START at 1 PM - 5 PM (virtual) Psychiatrist for medication management: Monday - Friday:  Please ARRIVE at 7:00 AM for registration Will START at 8:00 AM   Regretfully, due to limited availability, please be aware that you may not been seen on the same day as walk-in. Please consider making an appointment or trying again. Thank you for your patience and understanding.

## 2022-11-26 NOTE — Progress Notes (Signed)
Patient refused oral keppra 500 mg oral tab. I messaged Dr. Julian Reil he ordered I.V. Keppra 500 mg in 100 ml.
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
## 2022-11-27 ENCOUNTER — Encounter (HOSPITAL_COMMUNITY): Payer: Self-pay | Admitting: Licensed Clinical Social Worker

## 2022-11-27 ENCOUNTER — Telehealth (HOSPITAL_COMMUNITY): Payer: Self-pay | Admitting: Licensed Clinical Social Worker

## 2022-11-27 ENCOUNTER — Other Ambulatory Visit (HOSPITAL_COMMUNITY): Payer: Self-pay

## 2022-11-27 DIAGNOSIS — T50902A Poisoning by unspecified drugs, medicaments and biological substances, intentional self-harm, initial encounter: Secondary | ICD-10-CM | POA: Diagnosis present

## 2022-11-27 DIAGNOSIS — F250 Schizoaffective disorder, bipolar type: Secondary | ICD-10-CM | POA: Diagnosis not present

## 2022-11-27 DIAGNOSIS — T7491XA Unspecified adult maltreatment, confirmed, initial encounter: Secondary | ICD-10-CM | POA: Diagnosis present

## 2022-11-27 DIAGNOSIS — G40909 Epilepsy, unspecified, not intractable, without status epilepticus: Secondary | ICD-10-CM | POA: Diagnosis not present

## 2022-11-27 LAB — HIV ANTIBODY (ROUTINE TESTING W REFLEX): HIV Screen 4th Generation wRfx: NONREACTIVE

## 2022-11-27 LAB — RPR: RPR Ser Ql: NONREACTIVE

## 2022-11-27 MED ORDER — ARIPIPRAZOLE 5 MG PO TABS: 5.0000 mg | ORAL_TABLET | Freq: Every day | ORAL | 0 refills | Status: DC

## 2022-11-27 MED ORDER — PROPRANOLOL HCL 10 MG PO TABS
10.0000 mg | ORAL_TABLET | Freq: Two times a day (BID) | ORAL | 0 refills | Status: DC
  Filled 2022-11-27: qty 60, 30d supply, fill #0

## 2022-11-27 MED ORDER — LEVETIRACETAM 500 MG PO TABS
500.0000 mg | ORAL_TABLET | Freq: Two times a day (BID) | ORAL | 0 refills | Status: AC
  Filled 2022-11-27: qty 60, 30d supply, fill #0

## 2022-11-27 MED ORDER — LEVETIRACETAM 500 MG PO TABS: 500.0000 mg | ORAL_TABLET | Freq: Two times a day (BID) | ORAL | 0 refills | Status: DC

## 2022-11-27 MED ORDER — PROPRANOLOL HCL 10 MG PO TABS: 10.0000 mg | ORAL_TABLET | Freq: Two times a day (BID) | ORAL | 0 refills | Status: DC

## 2022-11-27 MED ORDER — SERTRALINE HCL 25 MG PO TABS: 25.0000 mg | ORAL_TABLET | Freq: Every day | ORAL | 0 refills | Status: DC

## 2022-11-27 MED ORDER — SERTRALINE HCL 25 MG PO TABS
25.0000 mg | ORAL_TABLET | Freq: Every day | ORAL | 0 refills | Status: DC
  Filled 2022-11-27: qty 30, 30d supply, fill #0

## 2022-11-27 MED ORDER — ARIPIPRAZOLE 5 MG PO TABS
5.0000 mg | ORAL_TABLET | Freq: Every day | ORAL | 0 refills | Status: DC
  Filled 2022-11-27: qty 30, 30d supply, fill #0
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
## 2022-11-27 NOTE — Plan of Care (Signed)
A/ox4 and on room air. No complaints of pain this shift. Sitter at bedside for SI. No overnight issues.    Problem: Education: Goal: Knowledge of General Education information will improve Description: Including pain rating scale, medication(s)/side effects and non-pharmacologic comfort measures Outcome: Progressing   Problem: Health Behavior/Discharge Planning: Goal: Ability to manage health-related needs will improve Outcome: Progressing   Problem: Clinical Measurements: Goal: Ability to maintain clinical measurements within normal limits will improve Outcome: Progressing Goal: Will remain free from infection Outcome: Progressing Goal: Diagnostic test results will improve Outcome: Progressing Goal: Respiratory complications will improve Outcome: Progressing Goal: Cardiovascular complication will be avoided Outcome: Progressing   Problem: Activity: Goal: Risk for activity intolerance will decrease Outcome: Progressing   Problem: Nutrition: Goal: Adequate nutrition will be maintained Outcome: Progressing   Problem: Coping: Goal: Level of anxiety will decrease Outcome: Progressing   Problem: Elimination: Goal: Will not experience complications related to bowel motility Outcome: Progressing Goal: Will not experience complications related to urinary retention Outcome: Progressing   Problem: Pain Management: Goal: General experience of comfort will improve Outcome: Progressing   Problem: Safety: Goal: Ability to remain free from injury will improve Outcome: Progressing   Problem: Skin Integrity: Goal: Risk for impaired skin integrity will decrease Outcome: Progressing
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
## 2022-11-30 ENCOUNTER — Encounter (HOSPITAL_COMMUNITY): Payer: Self-pay | Admitting: Emergency Medicine

## 2022-11-30 LAB — GC/CHLAMYDIA PROBE AMP (~~LOC~~) NOT AT ARMC
Chlamydia: NEGATIVE
Comment: NEGATIVE
Comment: NORMAL
Neisseria Gonorrhea: NEGATIVE

## 2022-12-08 ENCOUNTER — Encounter (HOSPITAL_COMMUNITY): Payer: Self-pay

## 2022-12-09 ENCOUNTER — Encounter (HOSPITAL_COMMUNITY): Payer: Self-pay

## 2022-12-10 ENCOUNTER — Encounter (HOSPITAL_COMMUNITY): Payer: Self-pay | Admitting: Physician Assistant

## 2022-12-10 ENCOUNTER — Ambulatory Visit (HOSPITAL_COMMUNITY): Payer: Medicaid Other | Admitting: Physician Assistant

## 2022-12-10 VITALS — BP 134/108 | HR 73 | Temp 98.4°F | Ht 62.0 in | Wt 149.4 lb

## 2022-12-10 DIAGNOSIS — F250 Schizoaffective disorder, bipolar type: Secondary | ICD-10-CM

## 2022-12-10 DIAGNOSIS — Z8669 Personal history of other diseases of the nervous system and sense organs: Secondary | ICD-10-CM | POA: Diagnosis not present

## 2022-12-10 DIAGNOSIS — F411 Generalized anxiety disorder: Secondary | ICD-10-CM | POA: Diagnosis not present

## 2022-12-10 MED ORDER — SERTRALINE HCL 25 MG PO TABS: 25.0000 mg | ORAL_TABLET | Freq: Every day | ORAL | 1 refills | Status: AC

## 2022-12-10 MED ORDER — PROPRANOLOL HCL 10 MG PO TABS: 10.0000 mg | ORAL_TABLET | Freq: Two times a day (BID) | ORAL | 1 refills | Status: AC

## 2022-12-10 MED ORDER — LEVETIRACETAM 500 MG PO TABS: 500.0000 mg | ORAL_TABLET | Freq: Two times a day (BID) | ORAL | 0 refills | Status: AC

## 2022-12-10 MED ORDER — ARIPIPRAZOLE 5 MG PO TABS: 5.0000 mg | ORAL_TABLET | Freq: Every day | ORAL | 1 refills | Status: AC
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
## 2022-12-25 ENCOUNTER — Ambulatory Visit (HOSPITAL_COMMUNITY)
Admission: RE | Admit: 2022-12-25 | Discharge: 2022-12-25 | Disposition: A | Discharge status: Home / Self Care | Payer: Medicaid Other | Source: Ambulatory Visit | Attending: Physician Assistant | Admitting: Physician Assistant

## 2022-12-25 ENCOUNTER — Encounter (HOSPITAL_COMMUNITY): Payer: Self-pay

## 2022-12-25 VITALS — BP 108/74 | HR 90 | Temp 98.4°F | Resp 18

## 2022-12-25 DIAGNOSIS — R1084 Generalized abdominal pain: Secondary | ICD-10-CM | POA: Diagnosis present
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
## 2022-12-25 NOTE — ED Triage Notes (Signed)
Pt presents to urgent care today for STD testing. Pt reports the only symptom she is experiencing is left lower abdominal pain. LMP reported to be 10/27, however pt reports another period within the same month on 10/2. Pt currently rates her pain as a 3/10, denies taking medications.

## 2022-12-26 ENCOUNTER — Other Ambulatory Visit: Payer: Self-pay

## 2022-12-28 ENCOUNTER — Telehealth (HOSPITAL_COMMUNITY): Payer: Self-pay | Admitting: Emergency Medicine

## 2022-12-28 ENCOUNTER — Other Ambulatory Visit: Payer: Self-pay

## 2022-12-28 LAB — CERVICOVAGINAL ANCILLARY ONLY
Bacterial Vaginitis (gardnerella): NEGATIVE
Candida Glabrata: NEGATIVE
Candida Vaginitis: POSITIVE — AB
Chlamydia: POSITIVE — AB
Comment: NEGATIVE
Comment: NEGATIVE
Comment: NEGATIVE
Comment: NEGATIVE
Comment: NEGATIVE
Comment: NORMAL
Neisseria Gonorrhea: POSITIVE — AB
Trichomonas: NEGATIVE

## 2022-12-28 MED ORDER — FLUCONAZOLE 150 MG PO TABS: 150.0000 mg | ORAL_TABLET | Freq: Once | ORAL | 0 refills | Status: AC

## 2022-12-28 MED ORDER — DOXYCYCLINE HYCLATE 100 MG PO CAPS: 100.0000 mg | ORAL_CAPSULE | Freq: Two times a day (BID) | ORAL | 0 refills | Status: AC

## 2022-12-28 NOTE — Telephone Encounter (Signed)
Per protocol, patient will need treatment with IM Rocephin 500mg  for positive gonorrhea.  Will also need treatment with Doxycycline and Diflucan Prescription sent to pharmacy on file [facility] notified

## 2022-12-30 ENCOUNTER — Ambulatory Visit (HOSPITAL_COMMUNITY)
Admission: EM | Admit: 2022-12-30 | Discharge: 2022-12-30 | Disposition: A | Discharge status: Home / Self Care | Payer: Medicaid Other | Attending: Family Medicine | Admitting: Family Medicine

## 2022-12-30 ENCOUNTER — Encounter (HOSPITAL_COMMUNITY): Payer: Self-pay

## 2022-12-30 DIAGNOSIS — A749 Chlamydial infection, unspecified: Secondary | ICD-10-CM | POA: Diagnosis not present

## 2022-12-30 DIAGNOSIS — A549 Gonococcal infection, unspecified: Secondary | ICD-10-CM | POA: Diagnosis not present

## 2022-12-30 MED ORDER — CEFTRIAXONE SODIUM 500 MG IJ SOLR
INTRAMUSCULAR | Status: AC
  Filled 2022-12-30: qty 500

## 2022-12-30 MED ORDER — CEFTRIAXONE SODIUM 500 MG IJ SOLR
500.0000 mg | Freq: Once | INTRAMUSCULAR | Status: AC
  Administered 2022-12-30: 500 mg via INTRAMUSCULAR

## 2022-12-30 NOTE — ED Triage Notes (Signed)
Patient here today for injection to be treated for STD.

## 2023-01-22 ENCOUNTER — Encounter (HOSPITAL_COMMUNITY): Payer: Medicaid Other | Admitting: Physician Assistant

## 2023-10-01 IMAGING — US US OB TRANSVAGINAL
1 series · 14 of 28 positions shown · non-contrast
Comparison: December 18, 2020.

CLINICAL DATA: Back pain.

EXAM:
TRANSVAGINAL OB ULTRASOUND
TECHNIQUE: Transvaginal ultrasound was performed for complete evaluation of the
gestation as well as the maternal uterus, adnexal regions, and
pelvic cul-de-sac.

[Series 1: us ob transvaginal · 39 acquisitions, 14 frames shown]
[im 2/39]
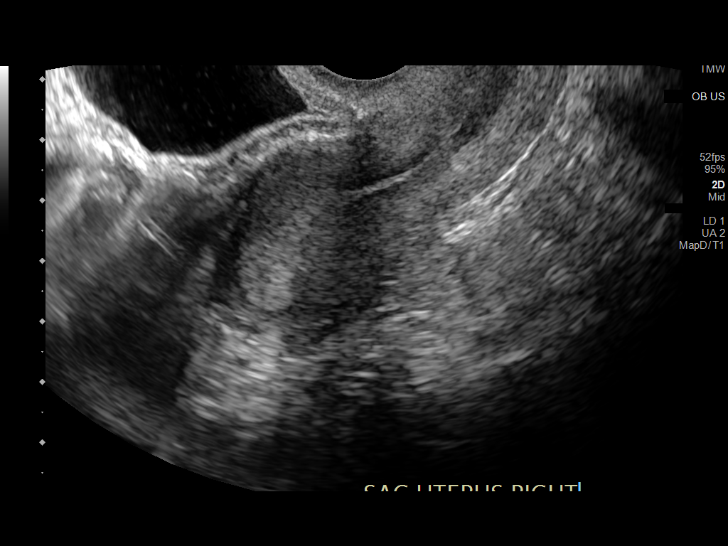
[im 5/39]
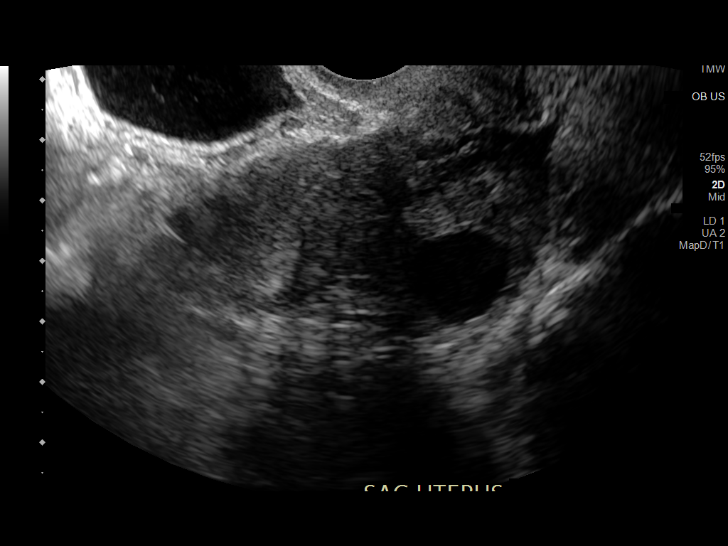
[im 8/39]
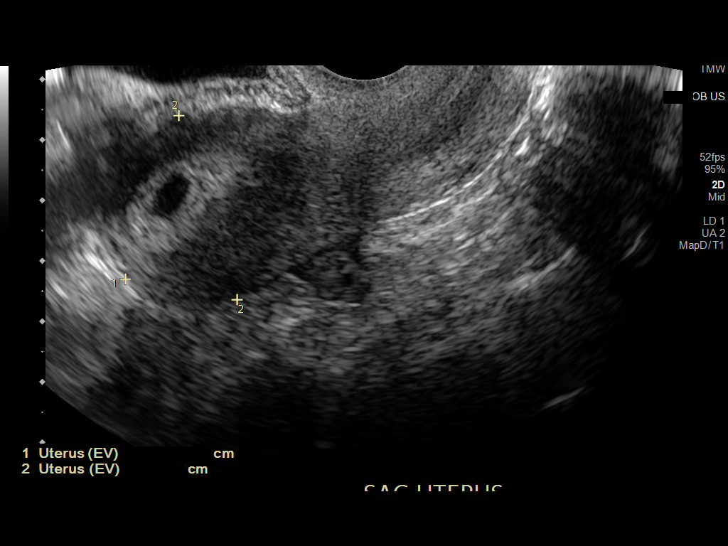
[im 10/39]
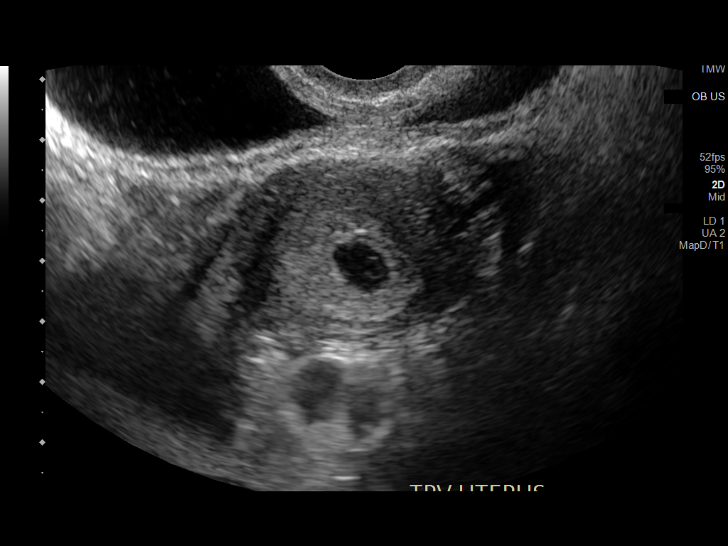
[im 13/39]
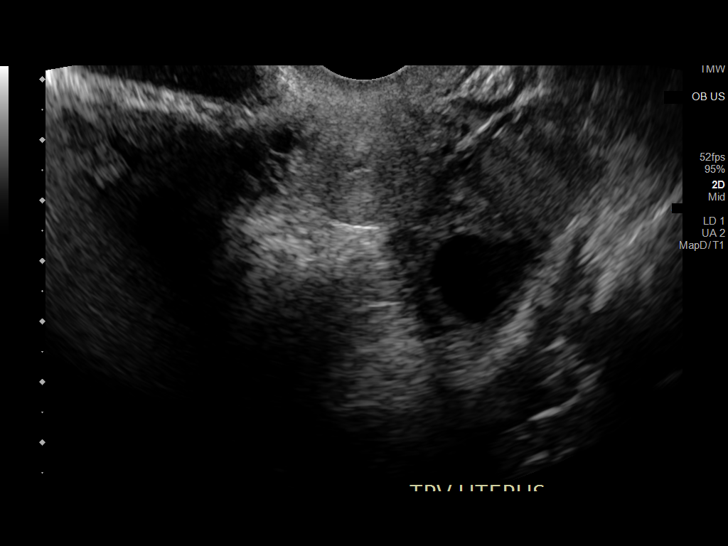
[im 16/39]
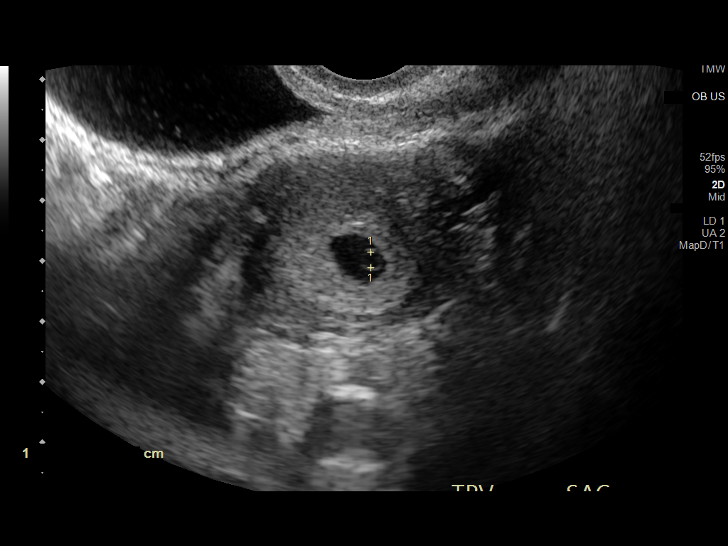
[im 19/39]
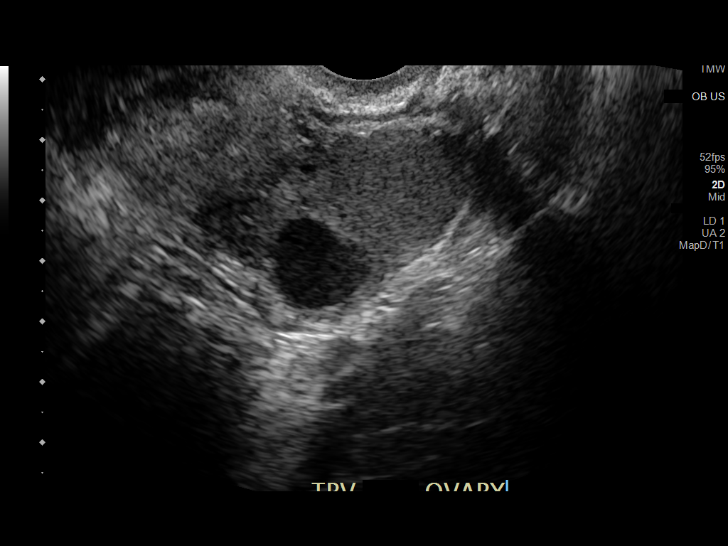
[im 22/39]
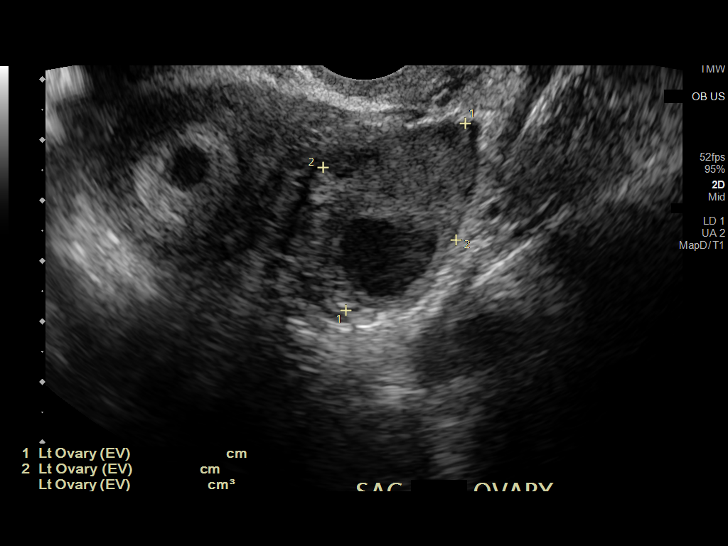
[im 24/39]
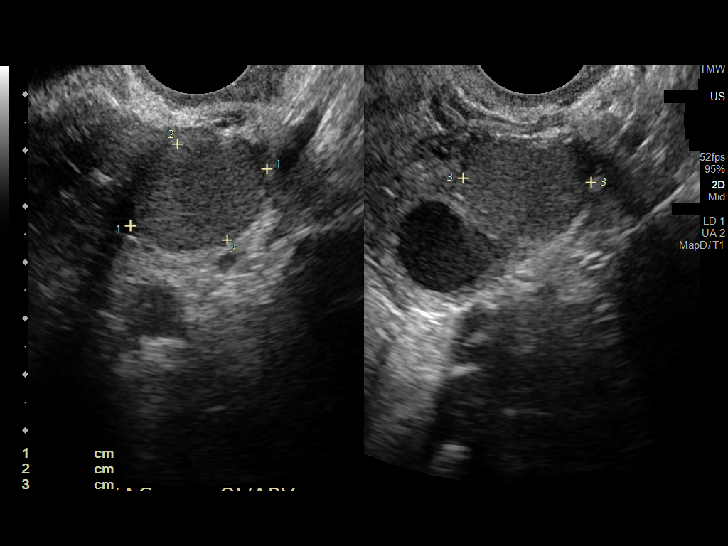
[im 27/39]
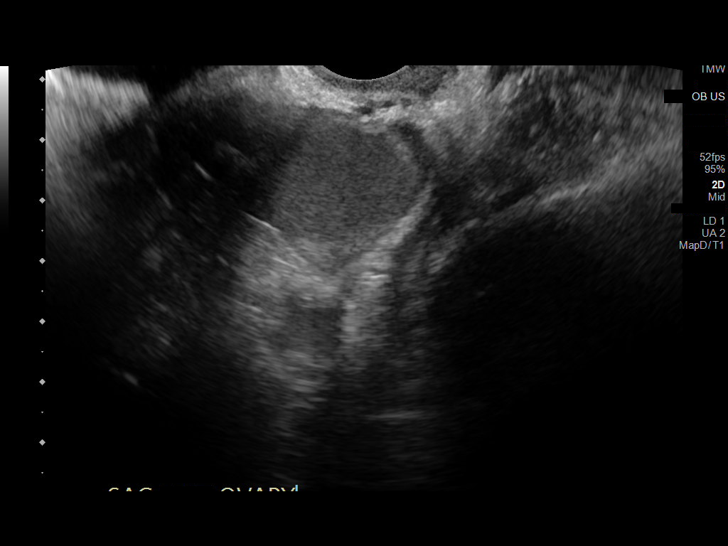
[im 30/39]
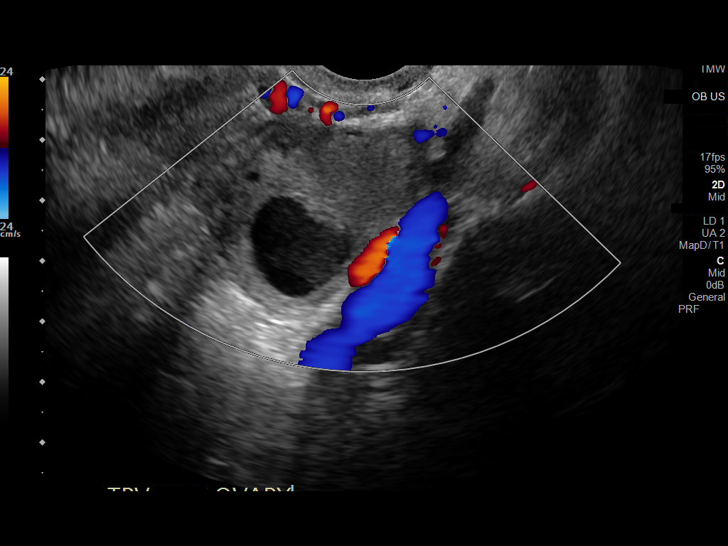
[im 33/39]
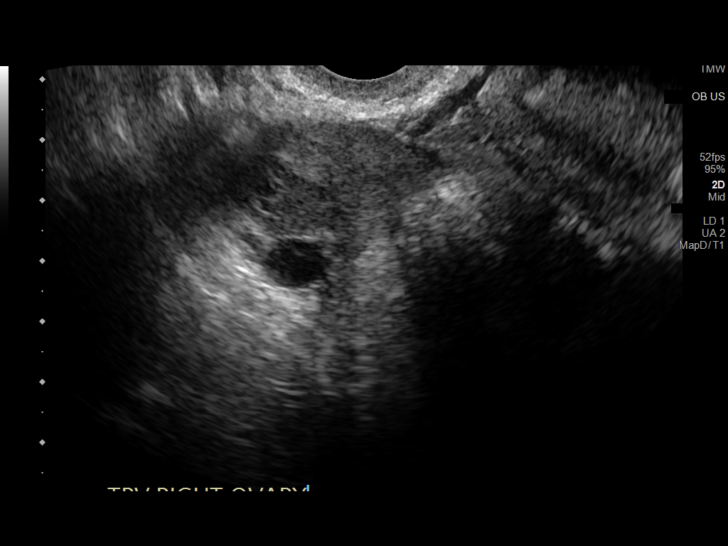
[im 36/39]
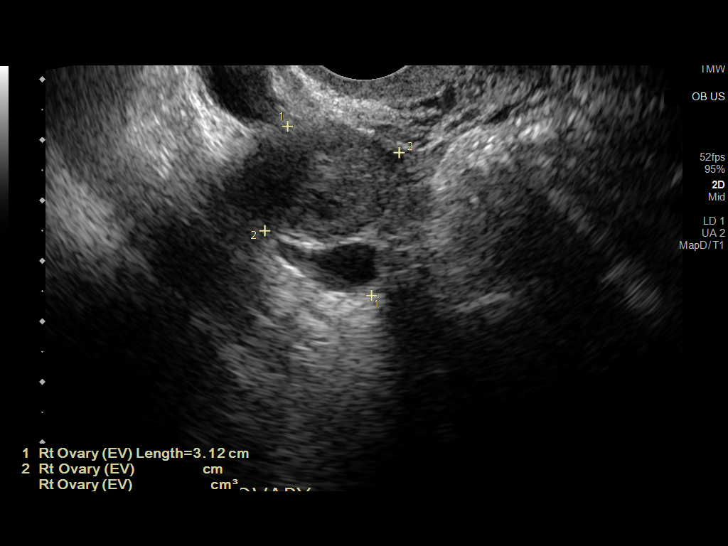
[im 39/39]
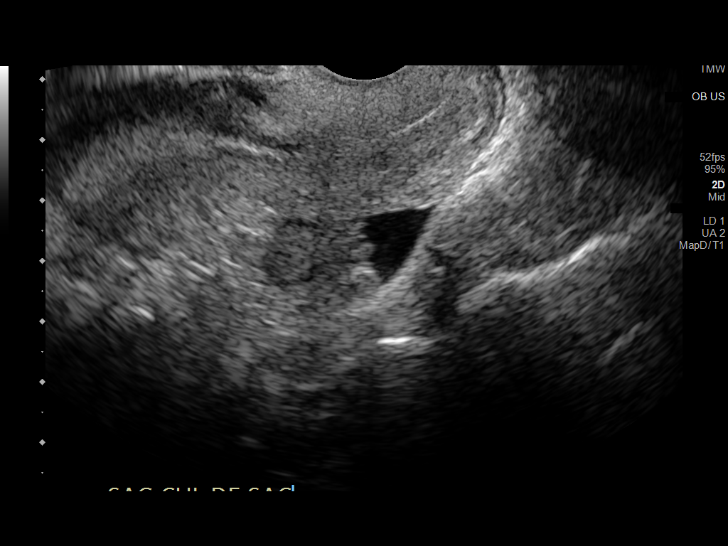

[14 of 28 positions shown; findings below may reference images not displayed]

FINDINGS: Intrauterine gestational sac: Single

Yolk sac:  Visualized.

Embryo:  Not Visualized.

Cardiac Activity: Not Visualized.

MSD: 8.0 mm   5 w   4 d

Subchorionic hemorrhage:  None visualized.

Maternal uterus/adnexae: Right ovary is unremarkable. 2.6 cm left
ovarian cyst is noted which is not significantly changed. Trace free
fluid is noted which most likely is physiologic.
IMPRESSION: Probable early intrauterine gestational sac with yolk sac, but no
fetal pole or cardiac activity yet visualized. Recommend follow-up
quantitative B-HCG levels and follow-up US in 14 days to assess
viability. This recommendation follows SRU consensus guidelines:
Diagnostic Criteria for Nonviable Pregnancy Early in the First
Trimester. N Engl J Med 3190; [DATE].

## 2023-10-24 IMAGING — US US OB < 14 WEEKS - US OB TV
1 series · 15 of 28 positions shown · non-contrast
Comparison: Central ultrasound 12/27/2020.

CLINICAL DATA: Spotting.

EXAM:
OBSTETRIC <14 WK US AND TRANSVAGINAL OB US
TECHNIQUE: Both transabdominal and transvaginal ultrasound examinations were
performed for complete evaluation of the gestation as well as the
maternal uterus, adnexal regions, and pelvic cul-de-sac.
Transvaginal technique was performed to assess early pregnancy.

[Series 1: us ob < 14 weeks - us ob tv · 15 of 31 slices shown]
[im 1/31]
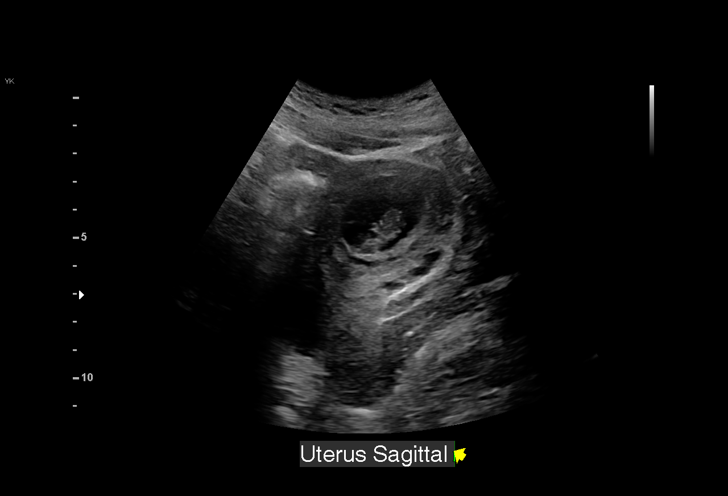
[im 3/31]
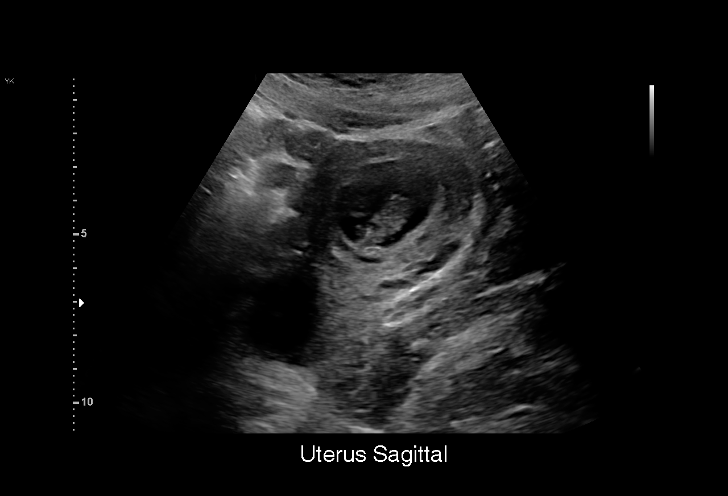
[im 5/31]
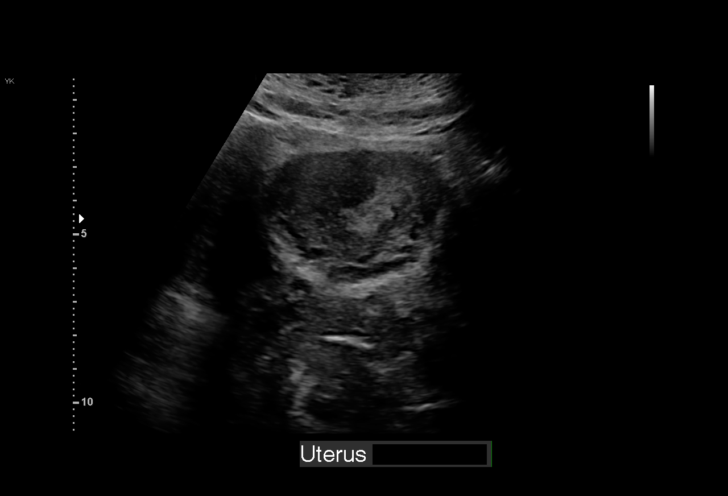
[im 7/31]
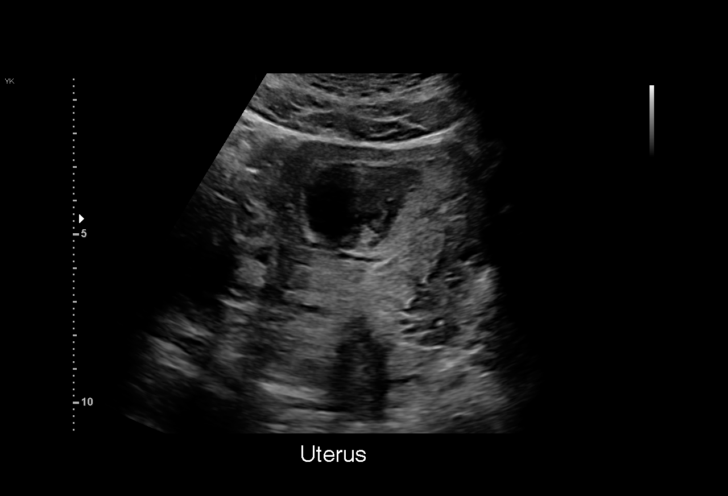
[im 9/31]
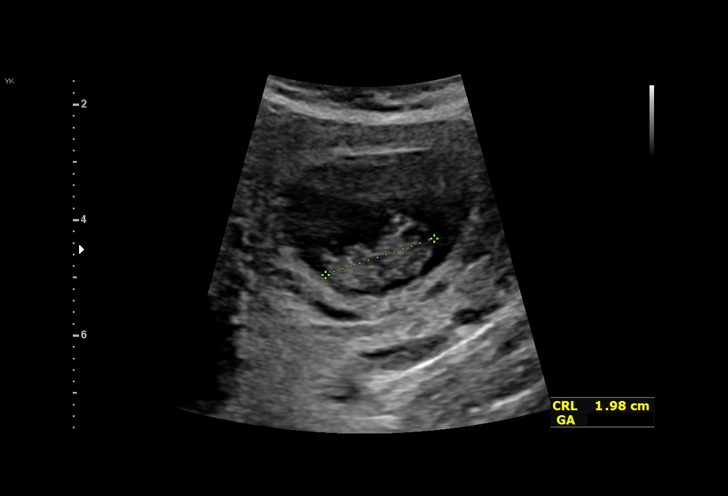
[im 12/31]
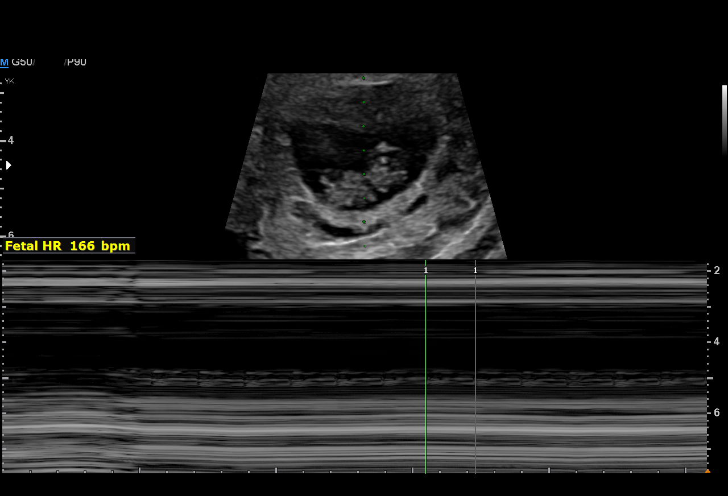
[im 14/31]
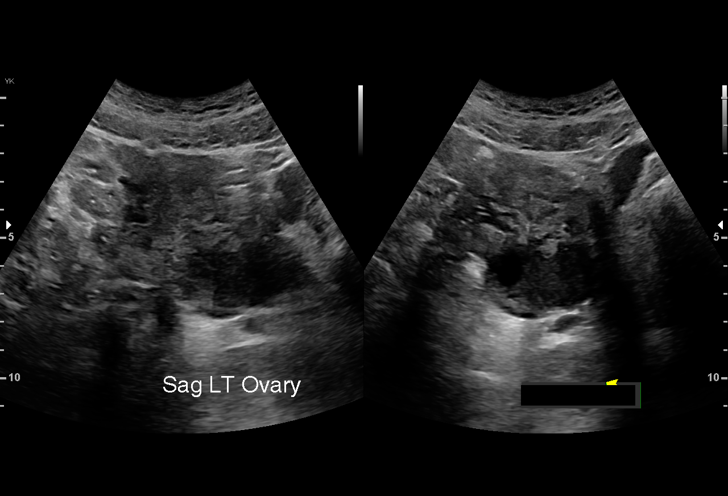
[im 16/31]
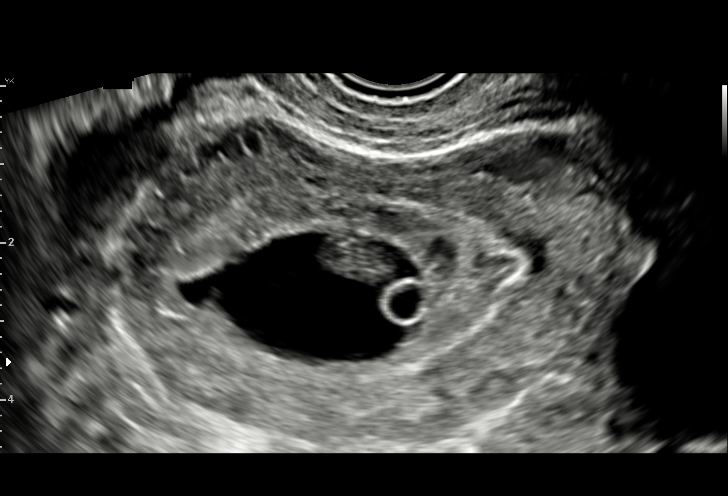
[im 17/31]
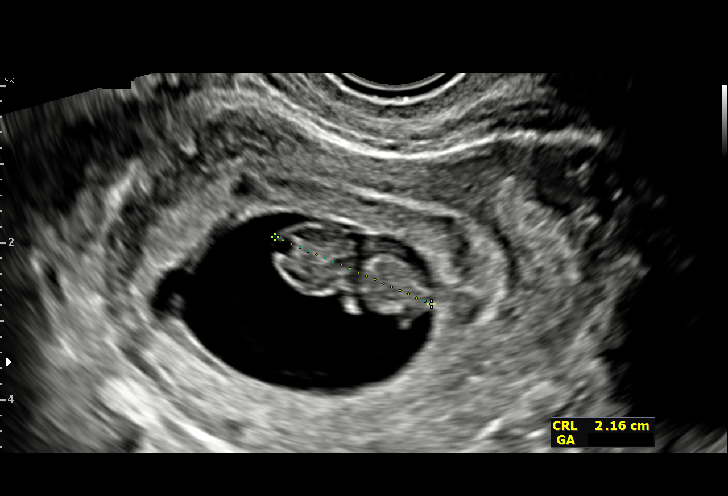
[im 19/31]
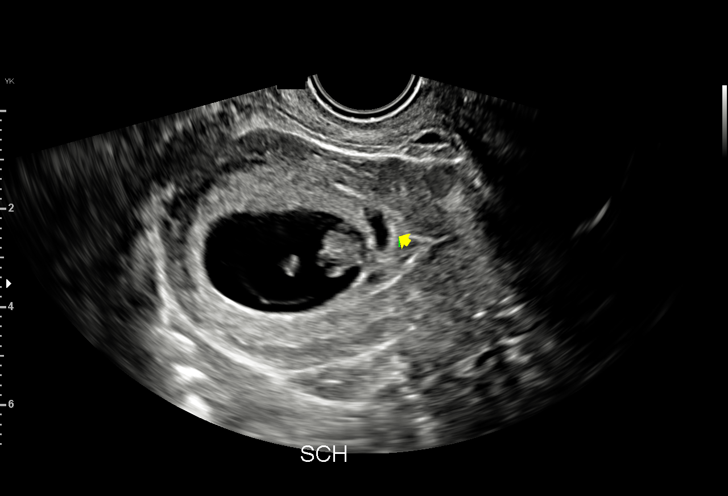
[im 22/31]
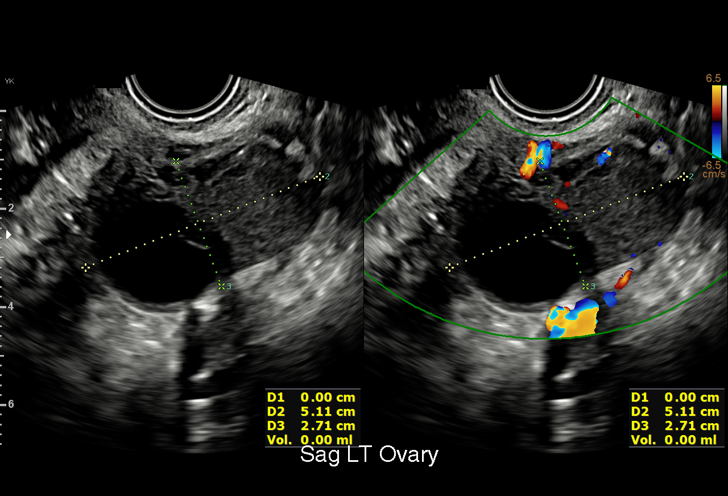
[im 24/31]
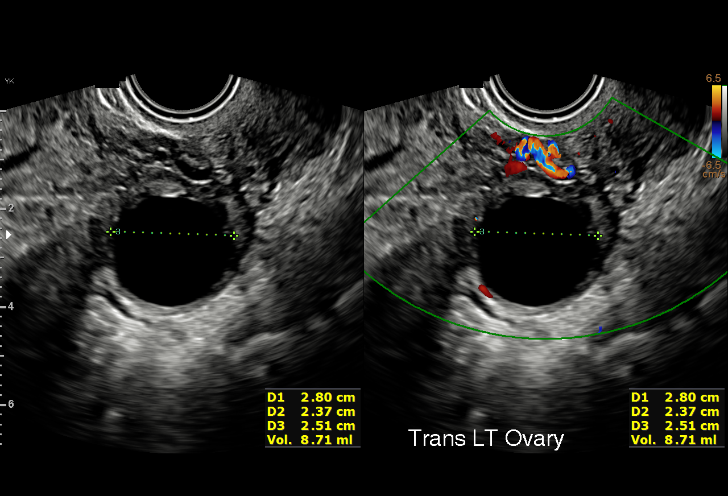
[im 26/31]
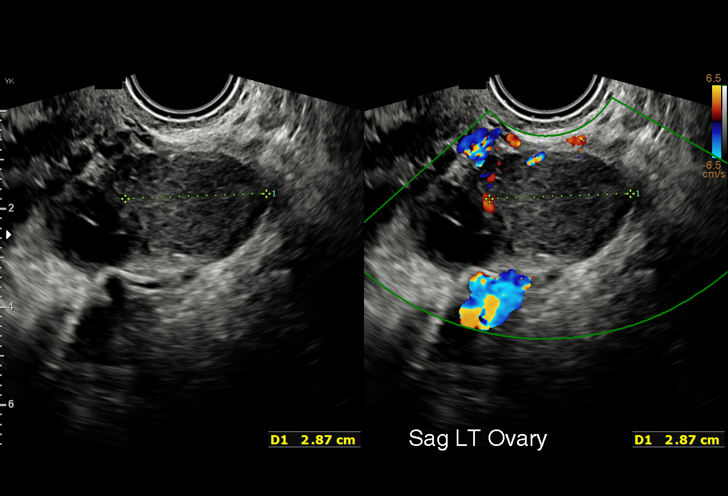
[im 28/31]
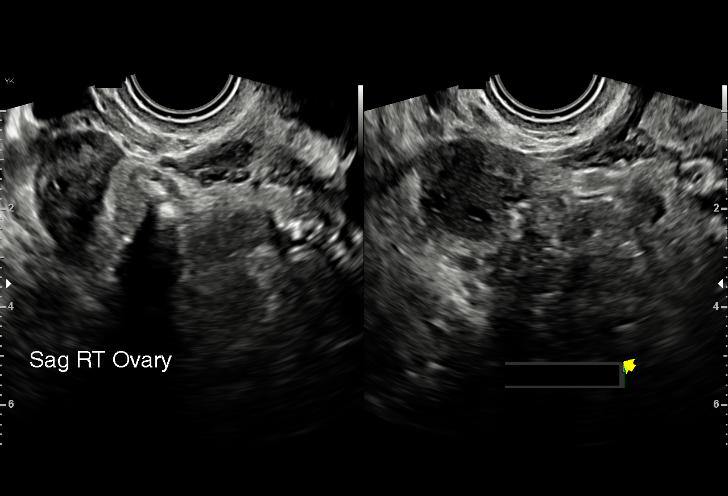
[im 31/31]
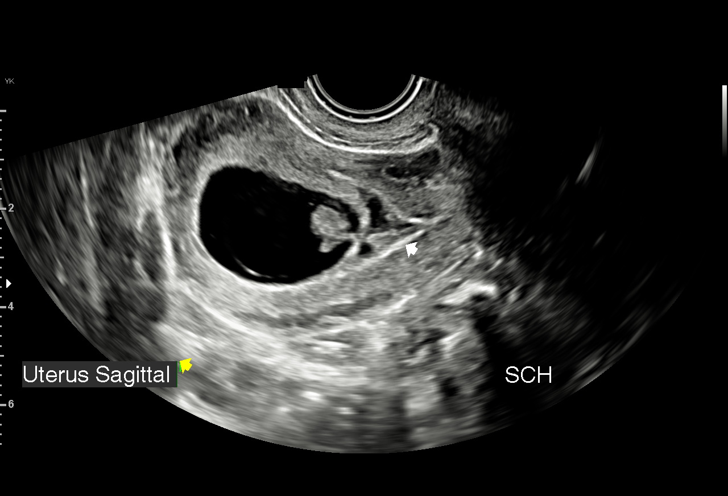

[15 of 28 positions shown; findings below may reference images not displayed]

FINDINGS: Intrauterine gestational sac: Single

Yolk sac:  Visualized.

Embryo:  Visualized.

Cardiac Activity: Visualized.

Heart Rate: 166 bpm

CRL:  19.9 mm   8 w   3 d                  US EDC: 08/28/2021

Subchorionic hemorrhage: Small subchorionic hemorrhage is seen
inferior to the gestational sac.

Maternal uterus/adnexae: Right ovary within normal limits. Within
the left ovary there is a 2.8 x 2.4 x 2.5 cm simple cyst.
Additionally within the left ovary, there is a 3.0 x 2.3 x 2.9 cm
focal area with internal low-level echoes. No pelvic free fluid.
IMPRESSION: 1. Single live intrauterine gestation measuring 8 weeks 3 days by
crown-rump length.
2. Small subchorionic hemorrhage.
3. 2.8 cm simple left ovarian cyst.
4. 3.0 cm area in the left ovary with internal low-level echoes may
represent complex cyst or endometrioma. Attention on follow-up
examination is recommended.

## 2023-11-06 IMAGING — US US OB TRANSVAGINAL
1 series · 15 of 19 positions shown · non-contrast
Comparison: 01/19/2021

CLINICAL DATA: Abdominal pain.  Ovarian cyst.

EXAM:
OBSTETRIC <14 WK ULTRASOUND
TECHNIQUE: Transabdominal ultrasound was performed for evaluation of the
gestation as well as the maternal uterus and adnexal regions.

[Series 1: us ob transvaginal · 19 acquisitions, 15 frames shown]
[im 1/19]
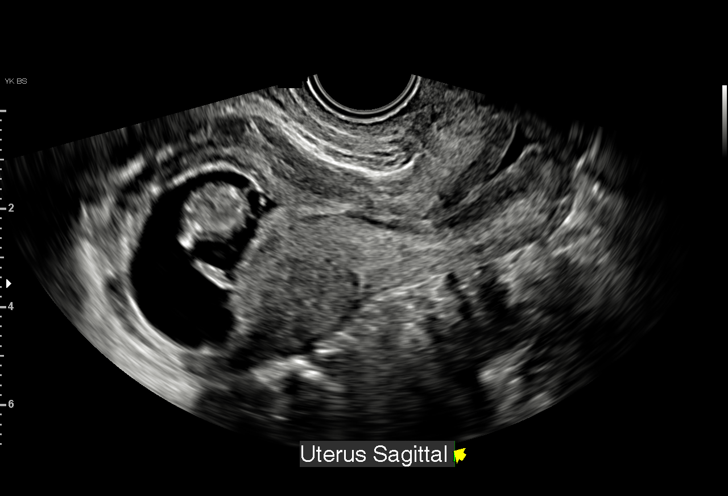
[im 2/19]
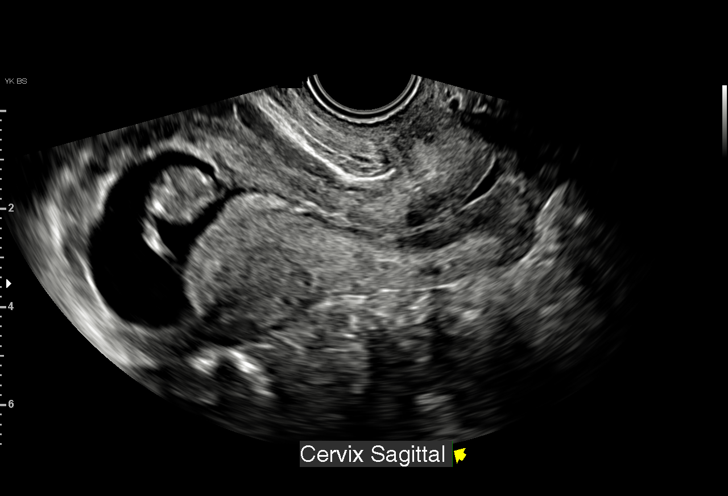
[im 4/19]
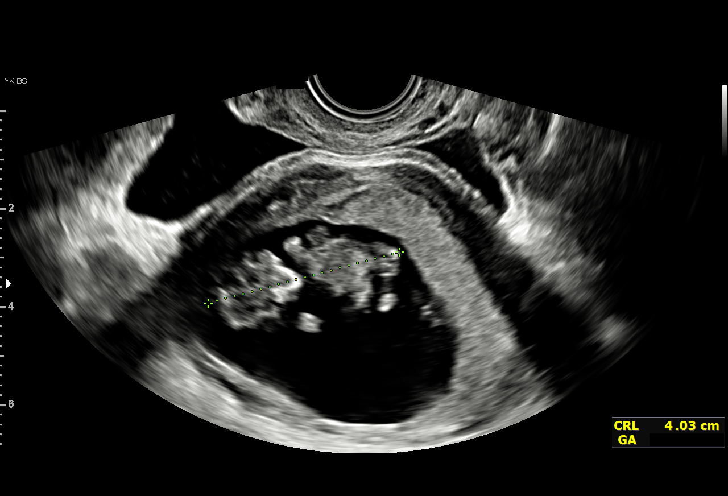
[im 5/19]
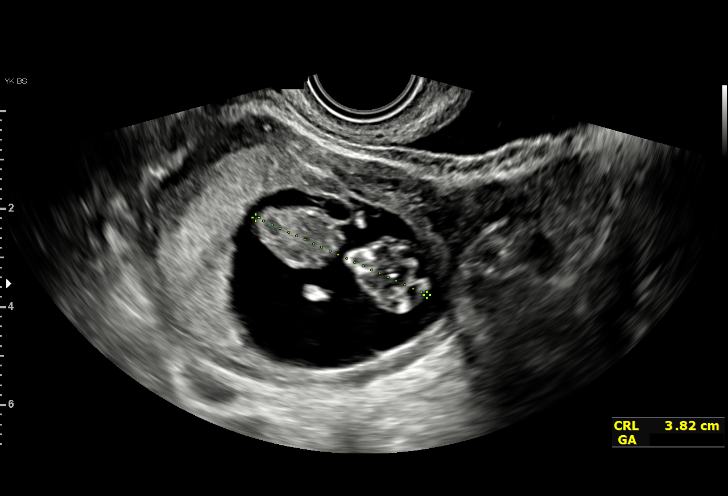
[im 6/19]
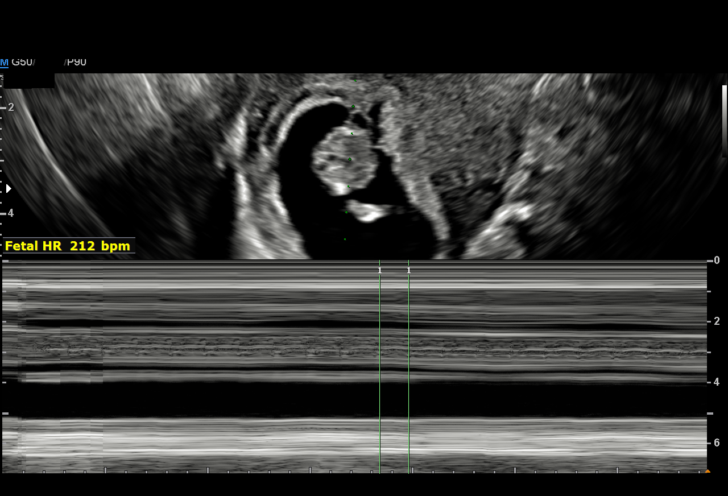
[im 7/19]
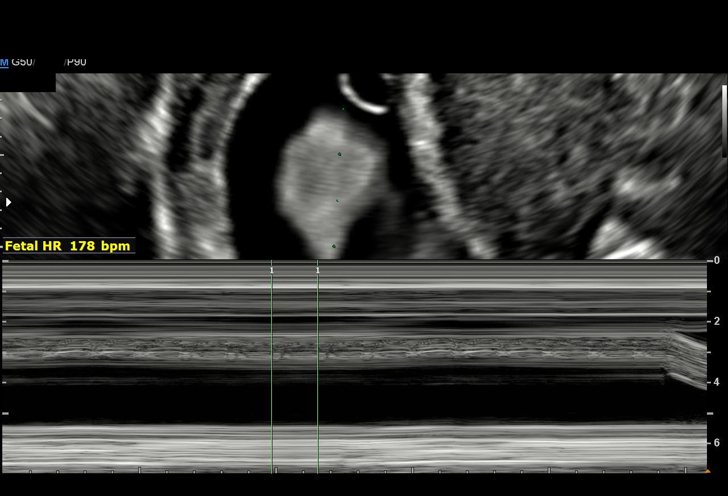
[im 9/19]
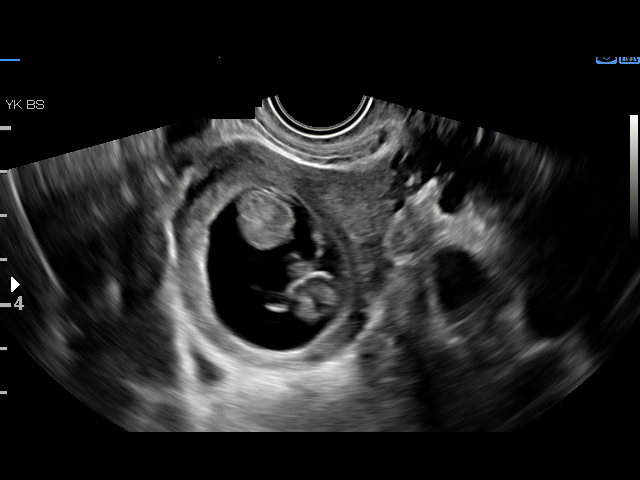
[im 10/19]
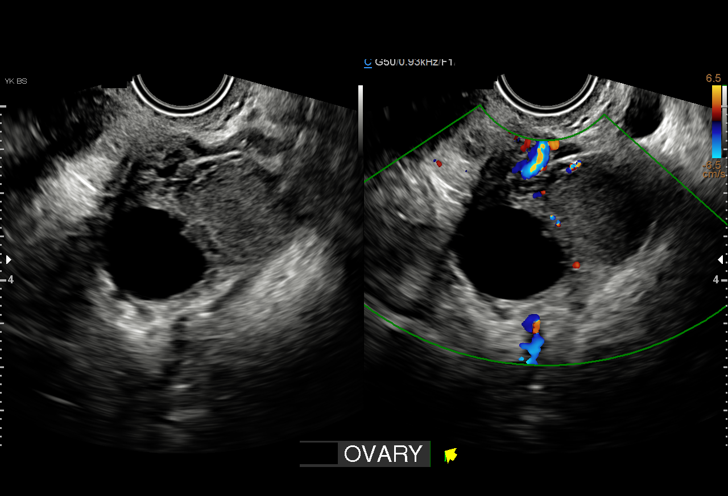
[im 11/19]
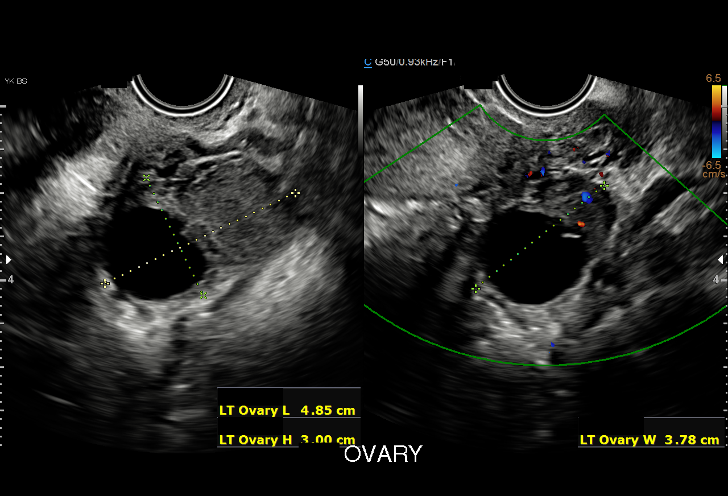
[im 13/19]
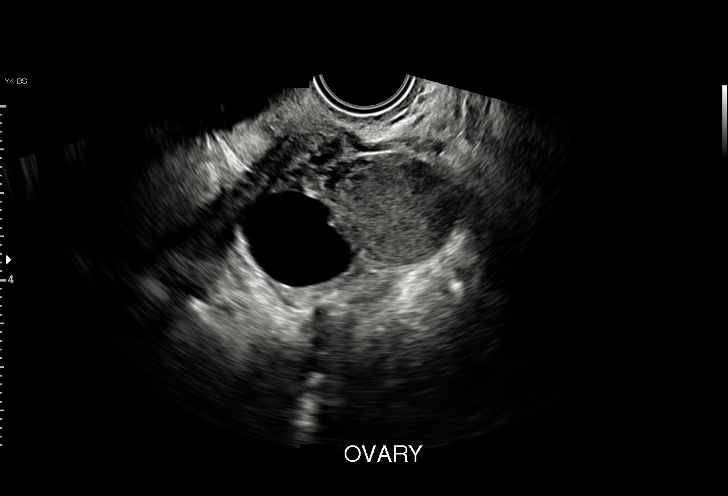
[im 14/19]
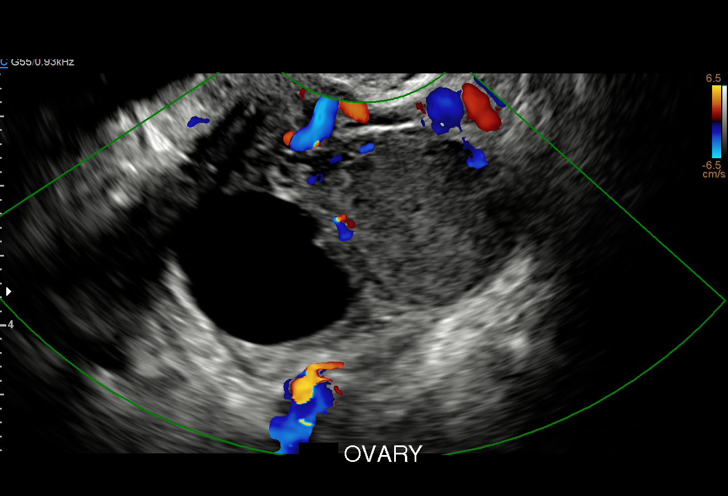
[im 15/19]
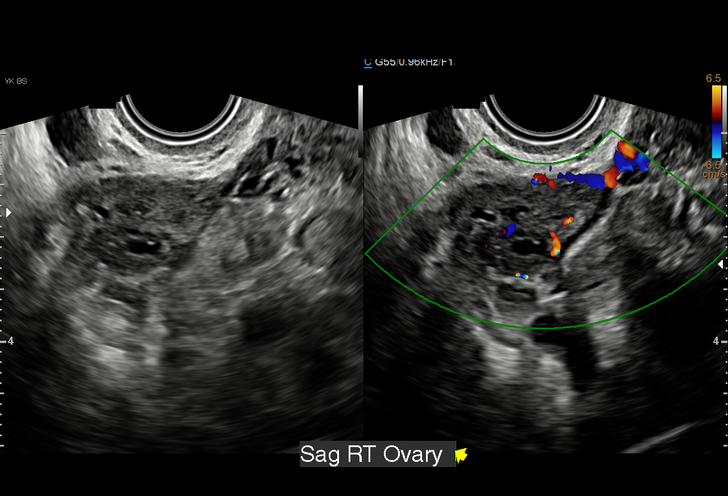
[im 16/19]
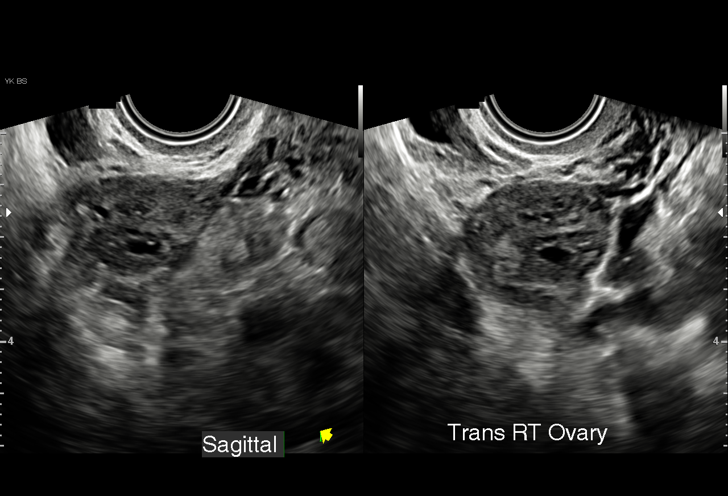
[im 18/19]
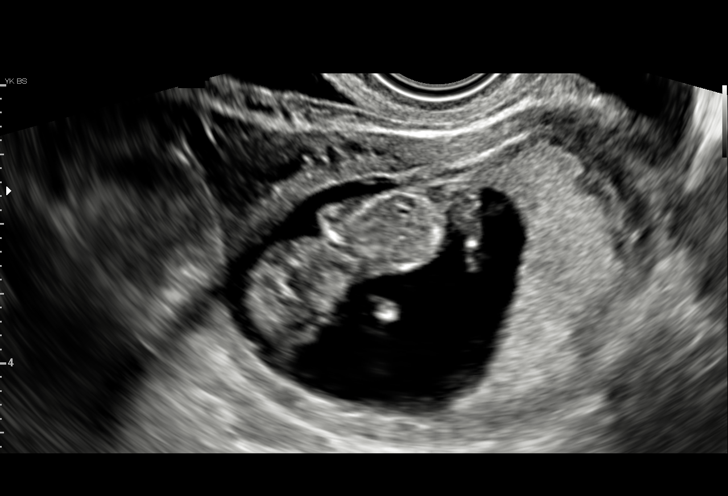
[im 19/19]
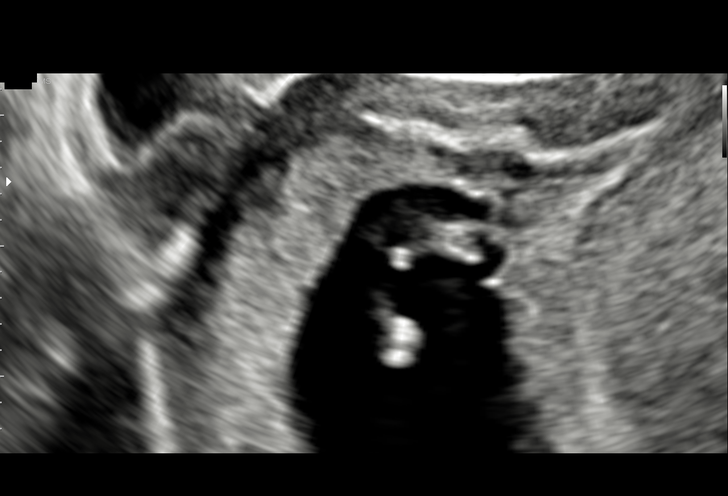

[15 of 19 positions shown; findings below may reference images not displayed]

FINDINGS: Intrauterine gestational sac: Single

Yolk sac:  Visualized.

Embryo:  Visualized.

Cardiac Activity: Visualized.

Heart Rate: 177 bpm

CRL:   39.2 mm   10 w 6 d                  US EDC: 08/24/2021

Subchorionic hemorrhage:  None visualized.

Maternal uterus/adnexae: No uterine mass.  Cervix is closed.

Hypoechoic left ovarian mass with surrounding blood flow. This
measures 3.0 x 2.6 x 2.5 cm. Adjacent simple appearing left ovarian
cyst measures 2.6 x 2.2 x 2.2 cm. These findings are essentially
stable from the prior ultrasound. No left adnexal mass. Color
Doppler arterial and venous blood flow seen to the left ovarian
tissue.

Normal right ovary.
IMPRESSION: 1. No significant change in left ovarian mass with low level echoes
consistent complicated cyst, still measuring 3 cm in greatest
dimension. Recommend follow-up ultrasound in 6-8 weeks to reassess.
2. Persistent 2.6 cm simple appearing left ovarian cyst. No followup
imaging recommended. Note: This recommendation does not apply to
premenarchal patients or to those with increased risk (genetic,
family history, elevated tumor markers or other high-risk factors)
of ovarian cancer. Reference: Radiology [DATE]):[id number].
3. Single live intrauterine pregnancy with a measured gestational
age of 10 weeks and 6 days, demonstrating normal progression since
the prior ultrasound
